# Patient Record
Sex: Female | Born: 1970 | Race: Black or African American | Hispanic: No | Marital: Married | Smoking: Never smoker
Health system: Southern US, Community
[De-identification: ages and names within clinical notes are randomized; demographics above are authoritative.]

## PROBLEM LIST (undated history)

## (undated) DIAGNOSIS — E041 Nontoxic single thyroid nodule: Secondary | ICD-10-CM

## (undated) DIAGNOSIS — R599 Enlarged lymph nodes, unspecified: Secondary | ICD-10-CM

## (undated) HISTORY — DX: Enlarged lymph nodes, unspecified: R59.9

## (undated) HISTORY — PX: POLYPECTOMY: SHX149

## (undated) HISTORY — DX: Nontoxic single thyroid nodule: E04.1

---

## 1999-01-25 ENCOUNTER — Encounter: Payer: Self-pay | Admitting: Emergency Medicine

## 1999-01-25 ENCOUNTER — Emergency Department (HOSPITAL_COMMUNITY): Admission: EM | Admit: 1999-01-25 | Discharge: 1999-01-25 | Payer: Self-pay | Admitting: Emergency Medicine

## 1999-02-04 ENCOUNTER — Encounter: Payer: Self-pay | Admitting: General Surgery

## 1999-02-04 ENCOUNTER — Ambulatory Visit (HOSPITAL_COMMUNITY): Admission: RE | Admit: 1999-02-04 | Discharge: 1999-02-04 | Payer: Self-pay | Admitting: General Surgery

## 1999-03-16 ENCOUNTER — Emergency Department (HOSPITAL_COMMUNITY): Admission: EM | Admit: 1999-03-16 | Discharge: 1999-03-16 | Payer: Self-pay | Admitting: Emergency Medicine

## 2000-02-02 ENCOUNTER — Other Ambulatory Visit: Admission: RE | Admit: 2000-02-02 | Discharge: 2000-02-02 | Payer: Self-pay | Admitting: Obstetrics and Gynecology

## 2000-02-02 ENCOUNTER — Encounter (INDEPENDENT_AMBULATORY_CARE_PROVIDER_SITE_OTHER): Payer: Self-pay | Admitting: Specialist

## 2001-01-24 ENCOUNTER — Other Ambulatory Visit: Admission: RE | Admit: 2001-01-24 | Discharge: 2001-01-24 | Payer: Self-pay | Admitting: Obstetrics and Gynecology

## 2002-01-24 ENCOUNTER — Other Ambulatory Visit: Admission: RE | Admit: 2002-01-24 | Discharge: 2002-01-24 | Payer: Self-pay | Admitting: Obstetrics and Gynecology

## 2002-06-15 ENCOUNTER — Ambulatory Visit (HOSPITAL_COMMUNITY): Admission: RE | Admit: 2002-06-15 | Discharge: 2002-06-15 | Payer: Self-pay | Admitting: Obstetrics and Gynecology

## 2002-06-15 ENCOUNTER — Encounter (INDEPENDENT_AMBULATORY_CARE_PROVIDER_SITE_OTHER): Payer: Self-pay | Admitting: *Deleted

## 2002-07-16 ENCOUNTER — Emergency Department (HOSPITAL_COMMUNITY): Admission: EM | Admit: 2002-07-16 | Discharge: 2002-07-16 | Payer: Self-pay | Admitting: Emergency Medicine

## 2003-03-14 ENCOUNTER — Other Ambulatory Visit: Admission: RE | Admit: 2003-03-14 | Discharge: 2003-03-14 | Payer: Self-pay | Admitting: Obstetrics and Gynecology

## 2003-04-06 ENCOUNTER — Encounter: Payer: Self-pay | Admitting: Family Medicine

## 2003-04-06 ENCOUNTER — Ambulatory Visit (HOSPITAL_COMMUNITY): Admission: RE | Admit: 2003-04-06 | Discharge: 2003-04-06 | Payer: Self-pay | Admitting: Family Medicine

## 2003-05-16 ENCOUNTER — Encounter (INDEPENDENT_AMBULATORY_CARE_PROVIDER_SITE_OTHER): Payer: Self-pay | Admitting: *Deleted

## 2003-05-16 ENCOUNTER — Ambulatory Visit (HOSPITAL_COMMUNITY): Admission: RE | Admit: 2003-05-16 | Discharge: 2003-05-16 | Payer: Self-pay | Admitting: Endocrinology

## 2003-05-16 ENCOUNTER — Encounter: Payer: Self-pay | Admitting: Endocrinology

## 2003-08-09 ENCOUNTER — Other Ambulatory Visit: Admission: RE | Admit: 2003-08-09 | Discharge: 2003-08-09 | Payer: Self-pay | Admitting: Obstetrics and Gynecology

## 2003-08-10 ENCOUNTER — Emergency Department (HOSPITAL_COMMUNITY): Admission: EM | Admit: 2003-08-10 | Discharge: 2003-08-11 | Payer: Self-pay | Admitting: Emergency Medicine

## 2003-11-19 ENCOUNTER — Ambulatory Visit (HOSPITAL_COMMUNITY): Admission: RE | Admit: 2003-11-19 | Discharge: 2003-11-19 | Payer: Self-pay | Admitting: Obstetrics and Gynecology

## 2004-01-28 ENCOUNTER — Ambulatory Visit (HOSPITAL_COMMUNITY): Admission: RE | Admit: 2004-01-28 | Discharge: 2004-01-28 | Payer: Self-pay | Admitting: Obstetrics & Gynecology

## 2004-01-30 ENCOUNTER — Observation Stay (HOSPITAL_COMMUNITY): Admission: AD | Admit: 2004-01-30 | Discharge: 2004-01-31 | Payer: Self-pay | Admitting: Obstetrics and Gynecology

## 2004-03-20 ENCOUNTER — Ambulatory Visit (HOSPITAL_COMMUNITY): Admission: RE | Admit: 2004-03-20 | Discharge: 2004-03-20 | Payer: Self-pay | Admitting: Obstetrics and Gynecology

## 2004-04-05 ENCOUNTER — Inpatient Hospital Stay (HOSPITAL_COMMUNITY): Admission: AD | Admit: 2004-04-05 | Discharge: 2004-04-08 | Payer: Self-pay | Admitting: Obstetrics and Gynecology

## 2005-04-01 ENCOUNTER — Inpatient Hospital Stay (HOSPITAL_COMMUNITY): Admission: AD | Admit: 2005-04-01 | Discharge: 2005-04-01 | Payer: Self-pay | Admitting: Obstetrics and Gynecology

## 2005-04-20 ENCOUNTER — Other Ambulatory Visit: Admission: RE | Admit: 2005-04-20 | Discharge: 2005-04-20 | Payer: Self-pay | Admitting: Obstetrics and Gynecology

## 2005-04-20 IMAGING — US US OB COMP +14 WK
1 series · 13 of 28 positions shown · non-contrast
Comparison: none

CLINICAL DATA: Assess fetal anatomy.  G2 P0 TAB1.  Patient states maternal serum screening was normal.

[Series 1: unknown · 0.23mm/px · 13 of 79 slices shown]
[im 3/79]
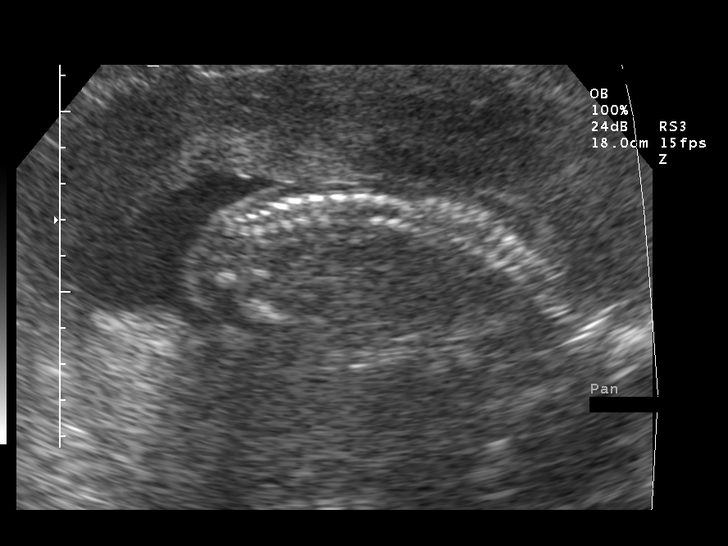
[im 9/79]
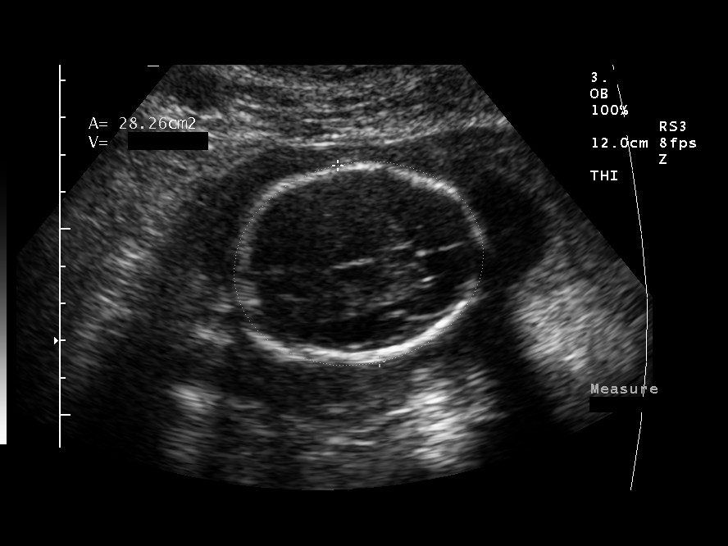
[im 15/79]
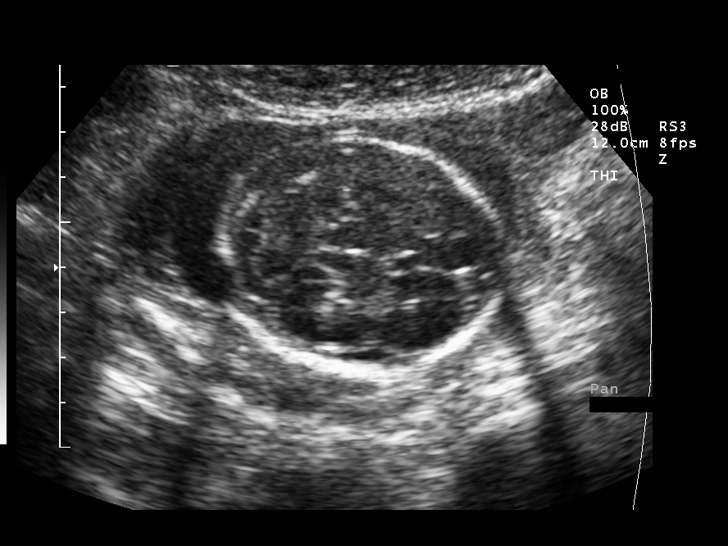
[im 21/79]
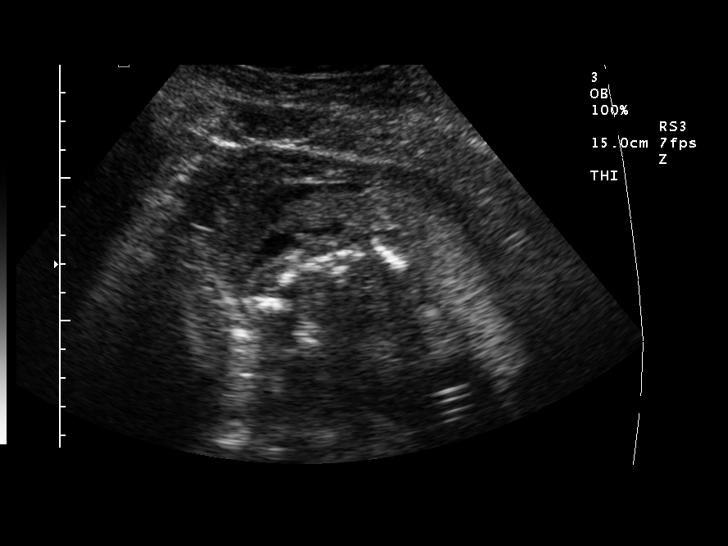
[im 27/79]
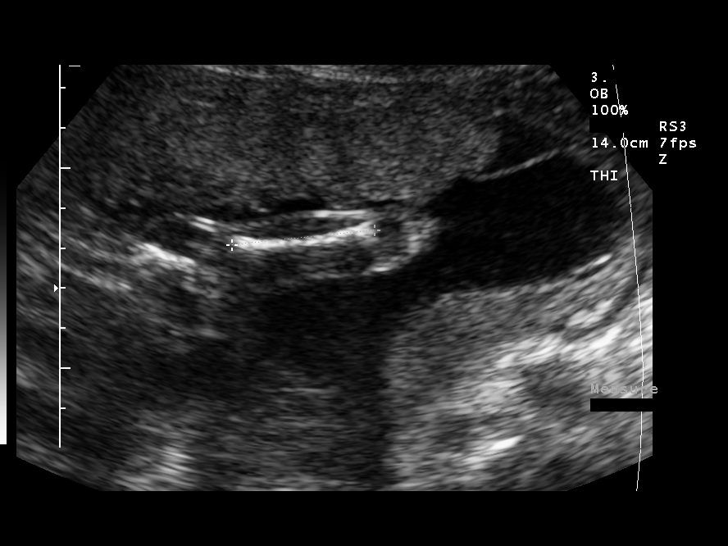
[im 32/79]
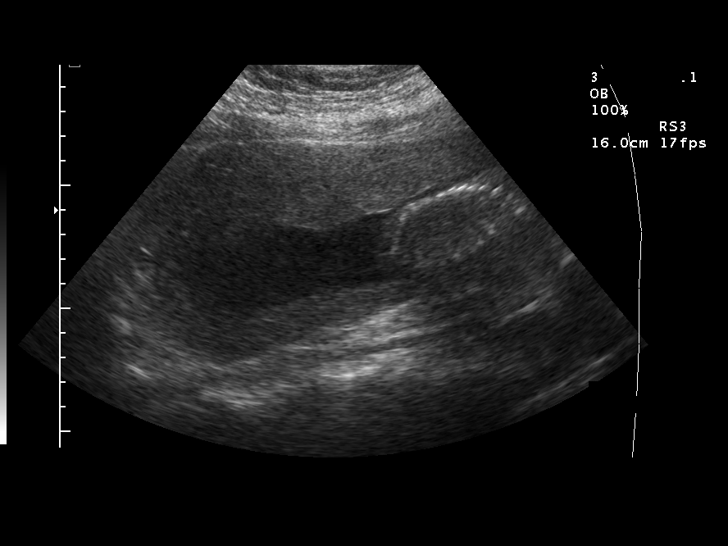
[im 41/79]
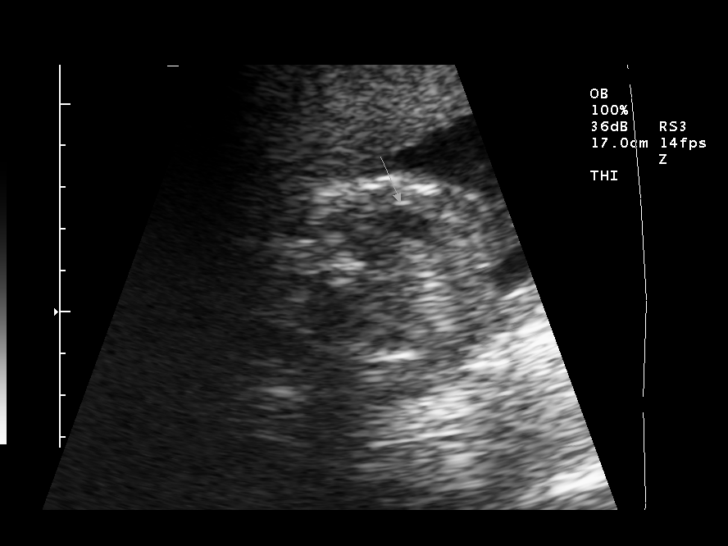
[im 47/79]
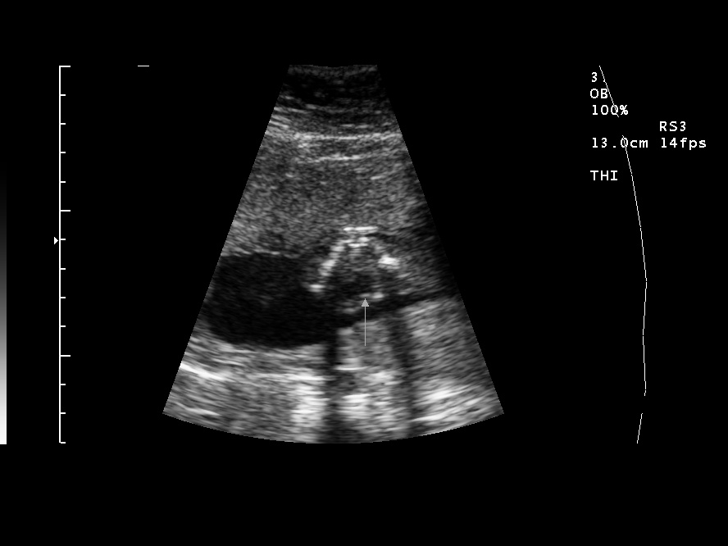
[im 53/79]
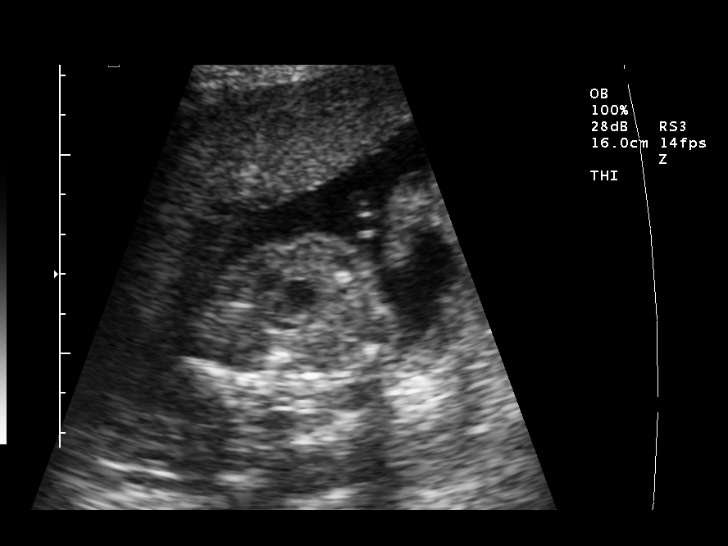
[im 58/79]
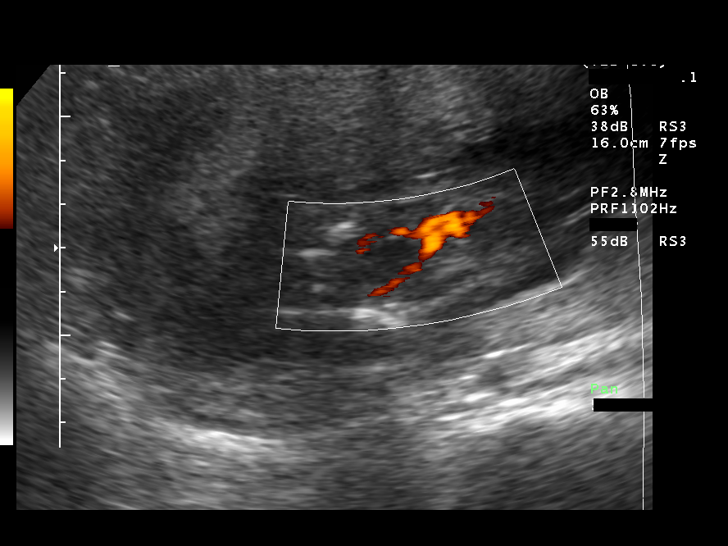
[im 64/79]
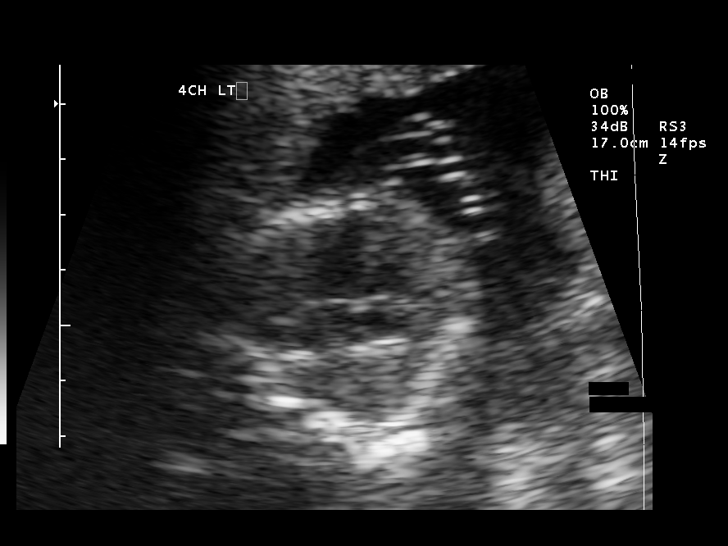
[im 70/79]
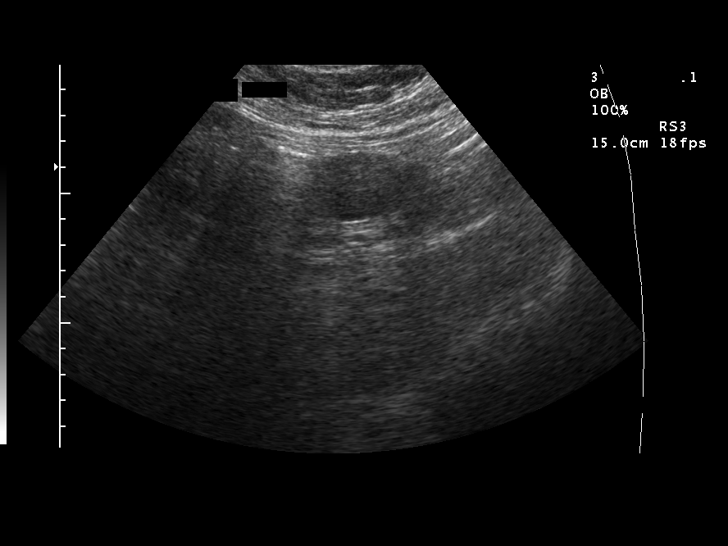
[im 76/79]
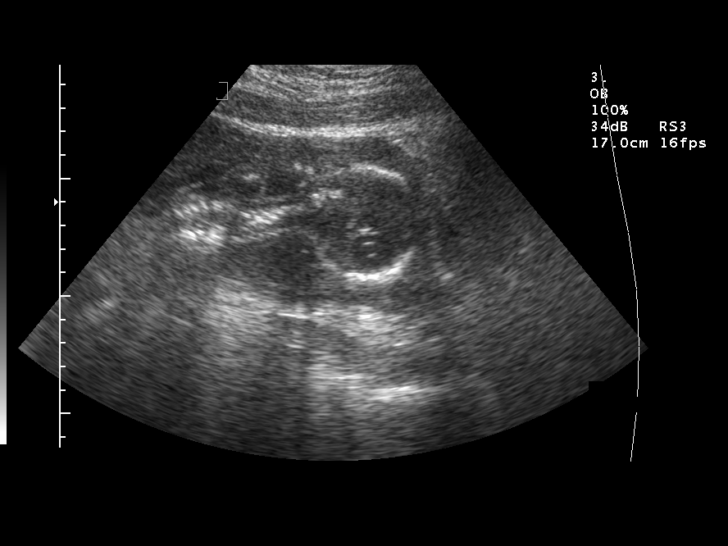

[13 of 28 positions shown; findings below may reference images not displayed]

OBSTETRICAL ULTRASOUND

NUMBER OF FETUSES:  1
HEART RATE:  158
MOVEMENT:  Yes
BREATHING:  No  
PRESENTATION:  Cephalic
PLACENTAL LOCATION:  Anterior
GRADE:  I
PREVIA:  No
AMNIOTIC FLUID (SUBJECTIVE):  Normal
AMNIOTIC FLUID (OBJECTIVE):   2.8 cm Vertical pocket 

FETAL BIOMETRY
BPD:  5.0 cm   21 w 1 d
HC:  19.0 cm  21 w 2 d
AC:  15.9 cm   21 w 0 d
FL:  3.6 cm   21 w 2 d
MEAN GA:      21 w 1 d
1st office US GA:         20 w 3 d (assigned)

FETAL ANATOMY
LATERAL VENTRICLES:    Visualized 
THALAMI/CSP:      Visualized 
POSTERIOR FOSSA:    Visualized 
NUCHAL REGION:    N/A
SPINE:      Visualized 
4 CHAMBER HEART ON LEFT:      Visualized 
STOMACH ON LEFT:      Visualized 
3 VESSEL CORD:    Visualized 
CORD INSERTION SITE:    Visualized 
KIDNEYS:    Visualized 
BLADDER:    Visualized 
EXTREMITIES:      Visualized 

ADDITIONAL ANATOMY VISUALIZED:    LVOT, RVOT, upper lip, orbits, profile, diaphragm, heel, 5th digit, ductal arch, aortic arch, and male genitalia

EVALUATION LIMITED BY:  Maternal habitus

MATERNAL FINDINGS
CERVIX:   3.7 cm Transabdominally
IMPRESSION: Single living intrauterine fetus in cephalic presentation.  Patient is 20 weeks 3 days by first office ultrasound and measures 21 weeks 1 day today.  Growth is appropriate.
No focal fetal or placental abnormality identified.

## 2005-06-23 ENCOUNTER — Ambulatory Visit (HOSPITAL_COMMUNITY): Admission: RE | Admit: 2005-06-23 | Discharge: 2005-06-23 | Payer: Self-pay | Admitting: Obstetrics and Gynecology

## 2005-06-23 ENCOUNTER — Encounter (INDEPENDENT_AMBULATORY_CARE_PROVIDER_SITE_OTHER): Payer: Self-pay | Admitting: *Deleted

## 2006-04-15 ENCOUNTER — Emergency Department (HOSPITAL_COMMUNITY): Admission: EM | Admit: 2006-04-15 | Discharge: 2006-04-15 | Payer: Self-pay | Admitting: Emergency Medicine

## 2007-02-25 ENCOUNTER — Ambulatory Visit (HOSPITAL_COMMUNITY): Admission: RE | Admit: 2007-02-25 | Discharge: 2007-02-25 | Payer: Self-pay | Admitting: Endocrinology

## 2007-03-10 ENCOUNTER — Encounter: Admission: RE | Admit: 2007-03-10 | Discharge: 2007-03-10 | Payer: Self-pay | Admitting: Endocrinology

## 2007-03-10 ENCOUNTER — Other Ambulatory Visit: Admission: RE | Admit: 2007-03-10 | Discharge: 2007-03-10 | Payer: Self-pay | Admitting: Interventional Radiology

## 2007-03-10 ENCOUNTER — Encounter (INDEPENDENT_AMBULATORY_CARE_PROVIDER_SITE_OTHER): Payer: Self-pay | Admitting: Specialist

## 2010-03-28 ENCOUNTER — Encounter: Admission: RE | Admit: 2010-03-28 | Discharge: 2010-03-28 | Payer: Self-pay | Admitting: Internal Medicine

## 2010-07-14 ENCOUNTER — Encounter
Admission: RE | Admit: 2010-07-14 | Discharge: 2010-07-14 | Payer: Self-pay | Source: Home / Self Care | Admitting: Obstetrics and Gynecology

## 2010-09-01 ENCOUNTER — Encounter
Admission: RE | Admit: 2010-09-01 | Discharge: 2010-09-01 | Payer: Self-pay | Source: Home / Self Care | Attending: Obstetrics and Gynecology | Admitting: Obstetrics and Gynecology

## 2010-10-24 ENCOUNTER — Emergency Department (HOSPITAL_COMMUNITY): Admission: EM | Admit: 2010-10-24 | Discharge: 2010-10-24 | Payer: Self-pay | Admitting: Emergency Medicine

## 2010-11-30 HISTORY — PX: CHOLECYSTECTOMY: SHX55

## 2010-12-21 ENCOUNTER — Encounter: Payer: Self-pay | Admitting: Family Medicine

## 2011-04-17 NOTE — Op Note (Signed)
NAME:  [name], [name]                       ACCOUNT NO.:  [id number]   MEDICAL RECORD NO.:  [id number]                   PATIENT TYPE:  INP   LOCATION:  9128                                 FACILITY:  [facility]   PHYSICIAN:  Gerrit Friends. Aldona Bar, [name]                DATE OF BIRTH:  December 08, 1970   DATE OF PROCEDURE:  04/05/2004  DATE OF DISCHARGE:                                 OPERATIVE REPORT   PREOPERATIVE DIAGNOSES:  1. Term pregnancy.  2. Nonreassuring fetal heart tracing.  3. Questionable cephalopelvic disproportion.   POSTOPERATIVE DIAGNOSES:  1. Term pregnancy.  2. Nonreassuring fetal heart tracing.  3. Questionable cephalopelvic disproportion.  4. Delivery of 7 pound 15 ounce female infant, Apgars 8 and 9.   PROCEDURE:  Primary low transverse cesarean section.   ANESTHESIA:  Spinal, Raul Del, [name]   HISTORY:  This 40 year old gravida 2, para 0, was admitted on the morning of  May 7 in early active labor.  She was having good contractions.  Her  pregnancy was complicated by positive group B strep antenatally.   At the time of admission the cervix was 1 cm plus dilated, about 80%  effaced, with the vertex essentially at -3 station.  After observation for a  period of time the baby descended to -2 station, at which time amniotomy was  carried out with production of clear fluid.  A pressure catheter was placed  and ultimately a scalp electrode was placed also.  At the time of admission  the fetal heart was reassuring but not reactive.  As her labor continued and  her contractions intensified as documented well by the IUPC, fetal heart  essentially developed no variability although no decelerations were seen.  The patient's cervix essentially was unchanged.  She was observed for a  period of several hours with essentially no change to her cervix and  decreasing variability.  Because of a nonreassuring fetal heart tracing in a  primigravida at term in early labor as well as a  presentation that was  relatively unfavorable for a primigravida at term in labor, it was felt best  to proceed with a primary cesarean section for delivery.   The patient was taken to the operating room, where after the satisfactory  induction of a spinal anesthetic by Dr. Tacy Dura, she was prepped and draped  in the usual manner.  A Foley catheter had been inserted prior to her  arrival in the labor area.   The patient's abdomen was taped up to allow good access to the lower  abdomen.  Once the patient was adequately draped and good anesthetic levels  were documented, the procedure was begun.  A Pfannenstiel incision was made  and with minimal difficulty dissected down sharply to and through the fascia  in a low transverse fashion.  Subfascial space was created inferiorly and  superiorly, muscles separated in the midline, peritoneum identified and  entered appropriately with care taken to avoid the bowel superiorly and the  bladder inferiorly.  At this time the vesicouterine peritoneum was  identified and incised in a low transverse fashion, pushed off the lower  uterine segment with ease, and then sharp incision into the uterus in a low  transverse fashion was made with the Metzenbaum scissors.  Fluid was clear  and with some difficulty, the baby was delivered from a vertex position.  There was some difficulty delivering the posterior shoulder.   Once the baby was delivered it cried spontaneously at once, was a female, was  found to weigh 7 pounds 15 ounces, and Apgars were assigned at 8 and 9.  The  infant was subsequently taken to the nursery in good condition.  After the  cord was clamped, the cord bloods were collected and placenta was delivered  intact.  The uterus was then exteriorized and manually rendered free of any  remaining products of conception.  Good uterine contractility was afforded  with slowly-given intravenous Pitocin and manual stimulation.  At this time  closure of  the uterine incision was carried out using first a single layer  of #1 Vicryl in a running locking fashion, followed by a layer of #1 Vicryl  in a running imbricating fashion.  An additional figure-of-eight of #1  Vicryl was applied to the right angle for additional hemostasis.  At this  time the incision looked dry, the uterus was well-contracted, tubes and  ovaries appeared to be normal and after the abdomen was lavaged of all free  blood and clot, the uterus was replaced into the abdomen.  At this time with  all counts being correct and no foreign bodies noted to be remaining in the  abdominal cavity, closure of the abdomen was begun in layers.  The abdominal  peritoneum was closed with 0 Vicryl in a running fashion and muscle secured  with same.  Assured of good fascial hemostasis, the fascia was then  reapproximated from angle to midline using 0 Vicryl in a running fashion.  The subcutaneous tissue was then rendered hemostatic and staples were then  used to close the skin.  A sterile pressure dressing was applied and the  patient at this time was transported to the recovery room in satisfactory  condition, having tolerated the procedure well.  The estimated blood loss  was 500 mL.  All counts correct x2.  In conclusion this patient, who had  essentially a nonreassuring fetal heart tracing on presentation, was  ultimately delivered by cesarean section because of a question of  cephalopelvic disproportion as well.  She was delivered of a 7 pound 15  ounce female infant with Apgars of 8 and 9.  At the conclusion of the  procedure both mother and baby were doing well in their respective recovery  areas.                                               Gerrit Friends. Aldona Bar, [name]    RMW/MEDQ  D:  04/05/2004  T:  04/07/2004  Job:  [id number]

## 2011-04-17 NOTE — Discharge Summary (Signed)
NAME:  [name], [name]                       ACCOUNT NO.:  [id number]   MEDICAL RECORD NO.:  [id number]                   PATIENT TYPE:  INP   LOCATION:  9128                                 FACILITY:  [facility]   PHYSICIAN:  Miguel Aschoff, [name]                    DATE OF BIRTH:  1970/12/31   DATE OF ADMISSION:  04/05/2004  DATE OF DISCHARGE:  04/08/2004                                 DISCHARGE SUMMARY   DISCHARGE DIAGNOSES:  1. Intrauterine pregnancy at term.  2. Nonreassuring fetal heart rate tracing.  3. Questionable cephalopelvic disproportion.   PROCEDURE:  Primary low transverse cesarean section.   SURGEON:  Gerrit Friends. Aldona Bar, [name]   COMPLICATIONS:  None.   HISTORY OF PRESENT ILLNESS:  This 40 year old gravida 2, para 0, presents at  term in early labor.  The patient's antepartum course had been  uncomplicated.  The patient did have a positive Group B Strep culture that  was performed in the office.   HOSPITAL COURSE:  The patient was admitted at this time and started on IV  penicillin.  Upon admission, she was having good contractions.  Her cervix  was about 1 cm dilated, 80% effaced, and -3 station.  The baby descended to  about a -2 station.  Amniotomy was carried out for clear fluid.  IUPC's were  placed.  The fetal heart rate was reassuring, but not completely reactive.  As the labor continued, fetal heart rate developed no variability, although,  there were no decelerations noted and the patient's cervix was essentially  unchanged.  This was observed for several hours with no change in her cervix  and decreasing variability.  Because of this nonreassuring fetal heart rate  tracing in early labor, a decision was made to proceed with a primary  cesarean section.  The patient was taken to the operating room on Apr 05, 2004, by Dr. Aldona Bar where a primary low transverse cesarean section was  performed with the delivery of a 7 pound 15 ounce female infant with Apgars of  8 and 9.   Delivery went without complications.  The patient's postoperative  course was benign without significant fevers. She was felt ready for  discharge on postoperative day #3.  She was sent home on a regular diet,  told to decrease activity, told to continue prenatal vitamins, and given a  prescription for Tylox one to two every four hours as needed for pain.  She  is to follow up in the office in four weeks.   DISCHARGE LABORATORY DATA:  Hemoglobin 10.9, white blood cell count 12.1.     Leilani Able, P.A.-C.                Miguel Aschoff, [name]    MB/MEDQ  D:  04/28/2004  T:  04/28/2004  Job:  [id number]

## 2011-04-17 NOTE — Discharge Summary (Signed)
NAME:  [name], [name]                       ACCOUNT NO.:  [id number]   MEDICAL RECORD NO.:  [id number]                   PATIENT TYPE:  OBV   LOCATION:  9168                                 FACILITY:  [facility]   PHYSICIAN:  Malva Limes, [name]                 DATE OF BIRTH:  Mar 10, 1971   DATE OF ADMISSION:  01/30/2004  DATE OF DISCHARGE:  01/31/2004                                 DISCHARGE SUMMARY   FINAL DIAGNOSES:  1. Intrauterine pregnancy at 30 and five-sevenths weeks gestation.  2. Pregnancy-induced hypertension.   HOSPITAL COURSE:  This 40 year old G1 P0-0-1-0 presents at 66 and five-  sevenths weeks gestation with some elevated blood pressures.  The patient's  antepartum course had been uncomplicated up to this point.  She was not  having any headaches or visual changes or epigastric pain.  She had had a  BPP performed on January 28, 2004 with an 8/8 and AFI of 10.8.  She was  seen today in the office with a blood pressure of 138/92 and her nonstress  test was nonreactive after 40 minutes.  At this point she is being admitted  to monitor fetal well-being and to assess the pregnancy-induced high blood  pressure.  Upon admission she did have 1+ lower extremity edema.  Her blood  pressure was about 138/92.  A PIH panel was drawn.  A repeat NST with BPP  was performed and 24-hour urine was collected.  The patient was stable  during her hospital stay, felt ready for discharge on hospital day #2.  All  the lab work was normal - liver function tests, uric acid.  She had a  hemoglobin of 12.4, white blood cell count of 12.0.  She had a normal BPP  with a score of 8/8 and an AFI of 11.9.  At this point the patient was felt  stable.  The 24-hour urine was finished and once we got those results back  we would decide what further management we needed on this patient.  She was  to be following up in the office on March 7 with Dr. Dareen Piano; was to call,  of course, before then with any  increased problems.     Leilani Able, P.A.-C.                Malva Limes, [name]    MB/MEDQ  D:  02/27/2004  T:  02/27/2004  Job:  [id number]

## 2011-04-17 NOTE — Op Note (Signed)
NAMEAVY, BARLETT NO.:  [id number]   MEDICAL RECORD NO.:  [id number]          PATIENT TYPE:  AMB   LOCATION:  SDC                           FACILITY:  [facility]   PHYSICIAN:  Carrington Clamp, [name] DATE OF BIRTH:  [birth year]   DATE OF PROCEDURE:  06/23/2005  DATE OF DISCHARGE:                                 OPERATIVE REPORT   PREOPERATIVE DIAGNOSIS:  Menometrorrhagia with endometrial thickening.   POSTOPERATIVE DIAGNOSIS:  Menometrorrhagia with endometrial thickening plus  possible endometrial polyps.   OPERATION/PROCEDURE:  Dilatation and curettage with hysteroscopy.   SURGEON:  Carrington Clamp, [name]   ASSISTANT:  None.   ANESTHESIA:  LMA.   SPECIMENS:  Uterine curettings.   ESTIMATED BLOOD LOSS:  Minimal.   IV FLUIDS:  700 mL.   URINARY OUTPUT:  Not measured.   COMPLICATIONS:  None.   FINDINGS:  Shaggy endometrium with possible polyps.  Counts were correct x3.  There were no medications.   DESCRIPTION OF PROCEDURE:  After adequate LMA anesthesia was achieved, the  patient was prepped and draped in the usual sterile fashion in the dorsal  lithotomy position.  Speculum was placed in the vagina after the red rubber  catheter had been emptied.  Had emptied the bladder and the single-tooth  tenaculum was used to grasp the cervix.  The cervix was dilated with Shawnie Pons  dilators and the hysteroscope passed inside.  A very shaggy endometrium with  possible multiple polyps were seen. The hysteroscope was removed and polyp  forceps were used to remove as much as possible and then curettage was  performed with the sharp curet.  This was alternating with hysteroscopy  until the hysteroscope noted that there was still endometrial tissue but  most of the polyps and larger groupings of  endometrium had been removed.  There was no vigorous scraping done, just the  gentle scraping in order to insure the removal of as much tissue without  interrupting the basal cell  layer.  All instruments were then withdrawn from  the vagina and the patient tolerated the procedure well and was returned to  the recovery room in stable condition.      Carrington Clamp, [name]  Electronically Signed     MH/MEDQ  D:  06/23/2005  T:  06/23/2005  Job:  [id number]

## 2011-04-17 NOTE — Op Note (Signed)
[facility]  Patient:    JAVAEH, MUSCATELLO Visit Number: [id number] MRN: [id number]          Service Type: DSU Location: [facility] Attending Physician:  Sharon Mt Dictated by:   Rande Brunt. Eda Paschal, [name] Proc. Date: 06/15/02 Admit Date:  06/15/2002 Discharge Date: 06/15/2002                             Operative Report  PREOPERATIVE DIAGNOSIS:       Dysfunctional uterine bleeding with endometrial polyp.  POSTOPERATIVE DIAGNOSIS:      Dysfunctional uterine bleeding with endometrial polyp.  OPERATION:                    Dilatation and curettage.  SURGEON:                      Daniel L. Eda Paschal, [name]  ANESTHESIA:                   General.  INDICATIONS:                  The patient is a 40 year old female who has had persistent menometrorrhagia.  In the office, sonohysterogram revealed a 2 cm intrauterine cavitary defect most consistent with an endometrial polyp.  She now enters the hospital for hysteroscopy, D&C and excision of the above.  FINDINGS:                     External and vaginal is within normal limits. Cervix is clean.  Uterus is normal size and shape without uterine descensus. Adnexa are palpably normal.  On hysteroscopic examination the patient had a multipolypoid lesion coming off the posterior fundal wall of about 2 cm.  In addition, she had extensive endometrial tissue from her Megace.  Other than this, intracavitary examination was completely unremarkable.  Tubal ostia, top of the fundus, anterior and posterior walls of the fundus, lower uterine segment and the cervical canal could all be visualized.  DESCRIPTION OF PROCEDURE:     After adequate general endotracheal anesthesia, the patient was placed in the dorsal lithotomy position, prepped and draped in the usual sterile manner.  A single-tooth tenaculum was placed in the anterior lip of the cervix.  The cervix was dilated to a #33 Pratt dilator.   A hysteroscopic examination was done with the hysteroscope, using 3% sorbitol to expand the intrauterine cavity and a camera for magnification.  An excellent view was obtained.  Pictures were taken for documentation.  The multipolypoid lesion was removed with the polyp forceps.  Sharp curettage was done removing most of the endometrium.  The patient was rehysteroscoped and she now had a clean cavity.  Estimated blood loss for the entire procedure was less than 100 cc with none replaced.  Fluid deficit was 250 cc.  The patient tolerated the procedure well and left the operating room in satisfactory condition. Dictated by:   Rande Brunt. Eda Paschal, [name] Attending Physician:  Sharon Mt DD:  06/15/02 TD:  06/20/02 Job: [id number]

## 2011-05-21 ENCOUNTER — Encounter (HOSPITAL_COMMUNITY): Payer: Self-pay | Admitting: Radiology

## 2011-05-21 ENCOUNTER — Emergency Department (HOSPITAL_COMMUNITY)
Admission: EM | Admit: 2011-05-21 | Discharge: 2011-05-21 | Disposition: A | Discharge status: Home / Self Care | Payer: 59 | Attending: Emergency Medicine | Admitting: Emergency Medicine

## 2011-05-21 ENCOUNTER — Emergency Department (HOSPITAL_COMMUNITY): Payer: 59

## 2011-05-21 ENCOUNTER — Inpatient Hospital Stay (INDEPENDENT_AMBULATORY_CARE_PROVIDER_SITE_OTHER)
Admission: RE | Admit: 2011-05-21 | Discharge: 2011-05-21 | Disposition: A | Discharge status: Home / Self Care | Payer: 59 | Source: Ambulatory Visit | Attending: Family Medicine | Admitting: Family Medicine

## 2011-05-21 DIAGNOSIS — K8020 Calculus of gallbladder without cholecystitis without obstruction: Secondary | ICD-10-CM | POA: Insufficient documentation

## 2011-05-21 DIAGNOSIS — R10813 Right lower quadrant abdominal tenderness: Secondary | ICD-10-CM

## 2011-05-21 DIAGNOSIS — R109 Unspecified abdominal pain: Secondary | ICD-10-CM | POA: Insufficient documentation

## 2011-05-21 LAB — DIFFERENTIAL
Basophils Absolute: 0 10*3/uL (ref 0.0–0.1)
Eosinophils Absolute: 0.1 10*3/uL (ref 0.0–0.7)
Eosinophils Relative: 1 % (ref 0–5)
Lymphocytes Relative: 23 % (ref 12–46)
Lymphs Abs: 2.3 10*3/uL (ref 0.7–4.0)
Monocytes Absolute: 0.6 10*3/uL (ref 0.1–1.0)
Monocytes Relative: 6 % (ref 3–12)
Neutro Abs: 7.1 10*3/uL (ref 1.7–7.7)

## 2011-05-21 LAB — URINALYSIS, ROUTINE W REFLEX MICROSCOPIC
Bilirubin Urine: NEGATIVE
Ketones, ur: 15 mg/dL — AB
Protein, ur: NEGATIVE mg/dL
Specific Gravity, Urine: 1.02 (ref 1.005–1.030)
Urobilinogen, UA: 1 mg/dL (ref 0.0–1.0)

## 2011-05-21 LAB — CBC
MCH: 23.5 pg — ABNORMAL LOW (ref 26.0–34.0)
WBC: 10.1 10*3/uL (ref 4.0–10.5)

## 2011-05-21 LAB — COMPREHENSIVE METABOLIC PANEL
ALT: 23 U/L (ref 0–35)
AST: 19 U/L (ref 0–37)
Albumin: 3.2 g/dL — ABNORMAL LOW (ref 3.5–5.2)
Alkaline Phosphatase: 50 U/L (ref 39–117)
BUN: 7 mg/dL (ref 6–23)
CO2: 22 mEq/L (ref 19–32)
Calcium: 7.9 mg/dL — ABNORMAL LOW (ref 8.4–10.5)
Chloride: 106 mEq/L (ref 96–112)
Creatinine, Ser: 0.57 mg/dL (ref 0.50–1.10)
Glucose, Bld: 87 mg/dL (ref 70–99)
Total Protein: 6.9 g/dL (ref 6.0–8.3)

## 2011-05-21 LAB — URINE MICROSCOPIC-ADD ON

## 2011-05-21 LAB — POCT URINALYSIS DIP (DEVICE): Glucose, UA: NEGATIVE mg/dL

## 2011-05-21 LAB — POCT PREGNANCY, URINE: Preg Test, Ur: NEGATIVE

## 2011-05-21 MED ORDER — IOHEXOL 300 MG/ML  SOLN
100.0000 mL | Freq: Once | INTRAMUSCULAR | Status: AC | PRN
  Administered 2011-05-21: 100 mL via INTRAVENOUS

## 2011-06-19 ENCOUNTER — Encounter (INDEPENDENT_AMBULATORY_CARE_PROVIDER_SITE_OTHER): Payer: Self-pay | Admitting: Surgery

## 2011-06-25 ENCOUNTER — Ambulatory Visit (INDEPENDENT_AMBULATORY_CARE_PROVIDER_SITE_OTHER): Payer: 59 | Admitting: Surgery

## 2011-06-25 ENCOUNTER — Encounter (INDEPENDENT_AMBULATORY_CARE_PROVIDER_SITE_OTHER): Payer: Self-pay | Admitting: Surgery

## 2011-06-25 VITALS — BP 128/82 | HR 84 | Temp 96.6°F

## 2011-06-25 DIAGNOSIS — K8010 Calculus of gallbladder with chronic cholecystitis without obstruction: Secondary | ICD-10-CM | POA: Insufficient documentation

## 2011-06-25 NOTE — Patient Instructions (Signed)
Call our surgery schedulers at [phone] when you are ready to schedule surgery 

## 2011-06-25 NOTE — Progress Notes (Signed)
[name] is a 40 y.o. female.    Chief Complaint  Patient presents with  . Abdominal Pain    Gallbladder    HPI HPI This is a 40 year old female who is a patient of Dr. Johny Blamer who presents with a recent episode of severe right upper quadrant abdominal pain. This occurred after a party at work where she today had a lot of snacks. The pain occurred in her right upper quadrant but moved into her back and spread down her right side. The pain persisted for about a day. This was associated with some nausea but no vomiting or diarrhea. She presented to the emergency department for evaluation on June 29. At that time her white count was normal at 10.1. Her liver function tests were within normal limits. A CT scan of the abdomen and pelvis was remarkable only for a 16 mm gallstone but no wall thickening. She has had no further episodes since that time.  Past Medical History  Diagnosis Date  . Swollen lymph nodes   . Thyroid nodule     Past Surgical History  Procedure Date  . Cesarean section   . Polypectomy ?    uterine    No family history on file.  Social History History  Substance Use Topics  . Smoking status: Never Smoker   . Smokeless tobacco: Never Used  . Alcohol Use: No    No Known Allergies  Current Outpatient Prescriptions  Medication Sig Dispense Refill  . Prenatal MV-Min-Fe Fum-FA-DHA (PRENATAL 1 PO) Take 1 tablet by mouth daily.          Review of Systems ROS Positive only for thyroid nodules, and recent episode of abdominal pain Physical Exam Physical Exam   Blood pressure 128/82, pulse 84, temperature 96.6 F (35.9 C), temperature source Temporal. WDWN in NAD HEENT:  EOMI, sclera anicteric Neck:  No masses, no thyromegaly Lungs:  CTA bilaterally; normal respiratory effort CV:  Regular rate and rhythm; no murmurs Abd:  +bowel sounds, soft, non-tender, no masses Ext:  Well-perfused; no edema Skin:  Warm, dry; no sign of  jaundice   Assessment/Plan  Chronic calculus cholecystitis  Recommend laparoscopic cholecystectomy with intraoperative cholangiogram.  I discussed the procedure in detail.  The patient was given Agricultural engineer.  We discussed the risks and benefits of a laparoscopic cholecystectomy including, but not limited to bleeding, infection, injury to surrounding structures such as the intestine or liver, bile leak, retained gallstones, need to convert to an open procedure, prolonged diarrhea, blood clots such as  DVT, common bile duct injury, anesthesia risks, and possible need for additional procedures.  We discussed the typical post-operative recovery course.   Davyd Podgorski K. 06/25/2011, 10:13 AM

## 2011-08-06 ENCOUNTER — Other Ambulatory Visit: Payer: Self-pay | Admitting: Obstetrics and Gynecology

## 2011-09-24 ENCOUNTER — Encounter (HOSPITAL_COMMUNITY)
Admission: RE | Admit: 2011-09-24 | Discharge: 2011-09-24 | Disposition: A | Discharge status: Home / Self Care | Payer: 59 | Source: Ambulatory Visit | Attending: Surgery | Admitting: Surgery

## 2011-09-24 LAB — SURGICAL PCR SCREEN: MRSA, PCR: NEGATIVE

## 2011-09-24 LAB — HCG, SERUM, QUALITATIVE: Preg, Serum: NEGATIVE

## 2011-09-24 LAB — CBC
Hemoglobin: 12.4 g/dL (ref 12.0–15.0)
MCHC: 32.3 g/dL (ref 30.0–36.0)
RBC: 5.33 MIL/uL — ABNORMAL HIGH (ref 3.87–5.11)

## 2011-09-24 NOTE — Progress Notes (Signed)
Quick Note:  This patient may proceed with surgery ______ 

## 2011-09-28 ENCOUNTER — Ambulatory Visit (HOSPITAL_COMMUNITY): Payer: 59

## 2011-09-28 ENCOUNTER — Ambulatory Visit (HOSPITAL_COMMUNITY)
Admission: RE | Admit: 2011-09-28 | Discharge: 2011-09-28 | Disposition: A | Discharge status: Home / Self Care | Payer: 59 | Source: Ambulatory Visit | Attending: Surgery | Admitting: Surgery

## 2011-09-28 ENCOUNTER — Other Ambulatory Visit (INDEPENDENT_AMBULATORY_CARE_PROVIDER_SITE_OTHER): Payer: Self-pay | Admitting: Surgery

## 2011-09-28 DIAGNOSIS — K8010 Calculus of gallbladder with chronic cholecystitis without obstruction: Secondary | ICD-10-CM

## 2011-09-28 DIAGNOSIS — Z01812 Encounter for preprocedural laboratory examination: Secondary | ICD-10-CM | POA: Insufficient documentation

## 2011-09-29 NOTE — Op Note (Signed)
[name], [name] NO.:  [id number]  MEDICAL RECORD NO.:  [id number]  LOCATION:  SDSC                         FACILITY:  [facility]  PHYSICIAN:  [name], [name] DATE OF BIRTH:  07-May-1971  DATE OF PROCEDURE:  09/28/2011 DATE OF DISCHARGE:                              OPERATIVE REPORT   PREOPERATIVE DIAGNOSIS:  Chronic calculus cholecystitis.  POSTOPERATIVE DIAGNOSIS:  Chronic calculus cholecystitis.  PROCEDURE:  Laparoscopic cholecystectomy with intraoperative cholangiogram.  SURGEON:  [name], MD  ASSISTANT:  Dr. Estelle Grumbles.  ANESTHESIA:  General endotracheal.  INDICATIONS:  This is a 40 year old female who presents with a recent attack of severe right upper quadrant abdominal pain.  She was evaluated in the emergency department was noted to have normal liver function tests.  A CT scan showed a 16 mm gallstones, but no wall thickening. She presents now for cholecystectomy.  DESCRIPTION OF PROCEDURE:  The patient was brought to the operating room, placed in supine position on operating table.  After adequate level of general anesthesia was obtained, the patient's abdomen was prepped with ChloraPrep and draped in sterile fashion.  Time-out was taken to the proper patient, proper procedure.  We infiltrated the area above the umbilicus with 0.25% Marcaine with epinephrine.  A transverse incision was made.  Dissection was carried down to the fascia.  The fascia was incised vertically.  We entered the peritoneal cavity bluntly.  A stay suture of 0 Vicryl was placed around the fascial opening.  The Hasson cannula was inserted and secured to stay suture. Pneumoperitoneum was obtained by insufflating CO2 maintaining a maximum pressure of 15 mmHg.  The laparoscope was inserted.  The patient was positioned in reverse Trendelenburg, tilted to her left.  An 11 mm port was placed in the subxiphoid position.  Two 5 mm ports were placed in the right  upper quadrant.  The gallbladder showed some chronic scarring but no acute inflammation.  We switched to a 30 degree laparoscope.  The gallbladder was grasped with clamp and lifted.  We opened the peritoneum around the hilum of the gallbladder dissected around the cystic duct and cystic artery.  A small opening was created on the cystic duct.  After ligated with a clip distally.  A Cook cholangiogram catheter was inserted and secured with a clip.  A cholangiogram was then obtained which showed good flow proximally and distally biliary tree with no sign of filling defect.  Contrast flowed easily into the duodenum.  The catheter was removed and the cystic duct was ligated with clips and divided.  The cystic artery was ligated with clips and divided.  The gallbladder was then dissected free from the liver.  The gallbladder was placed in an EndoCatch sac.  We cauterized the gallbladder fossa thoroughly for hemostasis.  The gallbladder and EndoCatch sac were then removed through the umbilical port site.  The fascia was closed with the pursestring suture.  We suctioned out any remaining irrigation and inspected again for hemostasis. Pneumoperitoneum was then released as we removed the trocars.  4-0 Monocryl was used to close the skin incisions.  Steri-Strips and clean dressings were applied.  The patient was extubated and brought to  recovery room in stable condition.  All sponge, instrument and needle counts correct.     [name], [name]     MKT/MEDQ  D:  09/28/2011  T:  09/28/2011  Job:  [id number]  Electronically Signed by Manus Rudd [name] on 09/29/2011 11:00:52 PM

## 2011-10-06 ENCOUNTER — Encounter (INDEPENDENT_AMBULATORY_CARE_PROVIDER_SITE_OTHER): Payer: Self-pay | Admitting: Surgery

## 2011-10-09 ENCOUNTER — Encounter (INDEPENDENT_AMBULATORY_CARE_PROVIDER_SITE_OTHER): Payer: Self-pay | Admitting: General Surgery

## 2011-10-12 ENCOUNTER — Encounter (INDEPENDENT_AMBULATORY_CARE_PROVIDER_SITE_OTHER): Payer: Self-pay | Admitting: Surgery

## 2011-10-13 ENCOUNTER — Ambulatory Visit (INDEPENDENT_AMBULATORY_CARE_PROVIDER_SITE_OTHER): Payer: 59 | Admitting: Surgery

## 2011-10-13 ENCOUNTER — Encounter (INDEPENDENT_AMBULATORY_CARE_PROVIDER_SITE_OTHER): Payer: Self-pay | Admitting: Surgery

## 2011-10-13 VITALS — BP 124/83 | HR 72 | Temp 97.8°F | Resp 14 | Ht 71.0 in | Wt 248.6 lb

## 2011-10-13 DIAGNOSIS — K8010 Calculus of gallbladder with chronic cholecystitis without obstruction: Secondary | ICD-10-CM

## 2011-10-13 NOTE — Progress Notes (Signed)
This patient is status post laparoscopic cholecystectomy with intraoperative cholangiogram.  The pathology report showed chronic cholecystitis.  The patient reports that the post-operative pain has resolved.  They have resumed a regular diet without problems and are having regular bowel movements.  On physical examination, all of the incisions are healing well with no signs of infection or bleeding.  Steri-strips have all been removed.  Impression:  The patient is doing well after laparoscopic cholecystectomy for chronic cholecystitis.  Plan:  The patient may resume full activity and regular diet.  They may follow-up with [place] on a PRN basis.

## 2011-10-13 NOTE — Patient Instructions (Signed)
Resume full activity  

## 2012-11-25 ENCOUNTER — Telehealth: Payer: Self-pay | Admitting: Internal Medicine

## 2012-11-25 NOTE — Telephone Encounter (Signed)
Patient received results of lab work. He states that he is unsure of the instructions that were given to him in the letter that he received. Please call him. [phone] ° ° [phone]

## 2012-11-28 NOTE — Telephone Encounter (Signed)
This pt has never seen Dr. Yetta Barre per EPIC, closing phone note.

## 2014-01-31 ENCOUNTER — Other Ambulatory Visit: Payer: Self-pay

## 2014-01-31 DIAGNOSIS — Z1231 Encounter for screening mammogram for malignant neoplasm of breast: Secondary | ICD-10-CM

## 2014-02-13 ENCOUNTER — Other Ambulatory Visit: Payer: Self-pay | Admitting: Obstetrics and Gynecology

## 2014-02-16 ENCOUNTER — Ambulatory Visit
Admission: RE | Admit: 2014-02-16 | Discharge: 2014-02-16 | Disposition: A | Discharge status: Home / Self Care | Payer: 59 | Source: Ambulatory Visit

## 2014-02-16 DIAGNOSIS — Z1231 Encounter for screening mammogram for malignant neoplasm of breast: Secondary | ICD-10-CM

## 2014-09-26 ENCOUNTER — Encounter (HOSPITAL_COMMUNITY): Payer: Self-pay | Admitting: Emergency Medicine

## 2014-09-26 ENCOUNTER — Emergency Department (HOSPITAL_COMMUNITY)
Admission: EM | Admit: 2014-09-26 | Discharge: 2014-09-26 | Disposition: A | Discharge status: Home / Self Care | Payer: 59 | Attending: Emergency Medicine | Admitting: Emergency Medicine

## 2014-09-26 ENCOUNTER — Other Ambulatory Visit: Payer: Self-pay

## 2014-09-26 ENCOUNTER — Emergency Department (HOSPITAL_COMMUNITY): Payer: 59

## 2014-09-26 ENCOUNTER — Emergency Department (HOSPITAL_COMMUNITY)
Admission: EM | Admit: 2014-09-26 | Discharge: 2014-09-26 | Disposition: A | Discharge status: Other Acute Inpatient Hospital | Payer: 59 | Source: Home / Self Care | Attending: Emergency Medicine | Admitting: Emergency Medicine

## 2014-09-26 DIAGNOSIS — R0602 Shortness of breath: Secondary | ICD-10-CM | POA: Diagnosis not present

## 2014-09-26 DIAGNOSIS — R079 Chest pain, unspecified: Secondary | ICD-10-CM | POA: Insufficient documentation

## 2014-09-26 DIAGNOSIS — E669 Obesity, unspecified: Secondary | ICD-10-CM | POA: Diagnosis not present

## 2014-09-26 DIAGNOSIS — Z79899 Other long term (current) drug therapy: Secondary | ICD-10-CM | POA: Diagnosis not present

## 2014-09-26 DIAGNOSIS — R0789 Other chest pain: Secondary | ICD-10-CM

## 2014-09-26 LAB — CBC WITH DIFFERENTIAL/PLATELET
BASOS PCT: 1 % (ref 0–1)
Basophils Absolute: 0.1 10*3/uL (ref 0.0–0.1)
Eosinophils Absolute: 0 10*3/uL (ref 0.0–0.7)
Eosinophils Relative: 0 % (ref 0–5)
HEMATOCRIT: 41.5 % (ref 36.0–46.0)
HEMOGLOBIN: 13.4 g/dL (ref 12.0–15.0)
LYMPHS ABS: 2.6 10*3/uL (ref 0.7–4.0)
LYMPHS PCT: 34 % (ref 12–46)
MCH: 23.1 pg — ABNORMAL LOW (ref 26.0–34.0)
MCHC: 32.3 g/dL (ref 30.0–36.0)
MCV: 71.7 fL — AB (ref 78.0–100.0)
MONOS PCT: 6 % (ref 3–12)
Monocytes Absolute: 0.5 10*3/uL (ref 0.1–1.0)
NEUTROS ABS: 4.5 10*3/uL (ref 1.7–7.7)
Neutrophils Relative %: 59 % (ref 43–77)
Platelets: 345 10*3/uL (ref 150–400)
RBC: 5.79 MIL/uL — ABNORMAL HIGH (ref 3.87–5.11)
RDW: 13.6 % (ref 11.5–15.5)
WBC: 7.7 10*3/uL (ref 4.0–10.5)

## 2014-09-26 LAB — COMPREHENSIVE METABOLIC PANEL
ALK PHOS: 67 U/L (ref 39–117)
ALT: 29 U/L (ref 0–35)
ANION GAP: 15 (ref 5–15)
AST: 25 U/L (ref 0–37)
Albumin: 4 g/dL (ref 3.5–5.2)
BILIRUBIN TOTAL: 0.4 mg/dL (ref 0.3–1.2)
BUN: 8 mg/dL (ref 6–23)
CHLORIDE: 102 meq/L (ref 96–112)
CO2: 23 meq/L (ref 19–32)
CREATININE: 0.64 mg/dL (ref 0.50–1.10)
Calcium: 9.2 mg/dL (ref 8.4–10.5)
GLUCOSE: 98 mg/dL (ref 70–99)
POTASSIUM: 4.4 meq/L (ref 3.7–5.3)
Sodium: 140 mEq/L (ref 137–147)
Total Protein: 8.1 g/dL (ref 6.0–8.3)

## 2014-09-26 LAB — I-STAT TROPONIN, ED
TROPONIN I, POC: 0 ng/mL (ref 0.00–0.08)
Troponin i, poc: 0 ng/mL (ref 0.00–0.08)

## 2014-09-26 MED ORDER — NITROGLYCERIN 0.4 MG SL SUBL
0.4000 mg | SUBLINGUAL_TABLET | SUBLINGUAL | Status: DC | PRN
Start: 2014-09-26 — End: 2014-09-26

## 2014-09-26 MED ORDER — ASPIRIN 81 MG PO CHEW
324.0000 mg | CHEWABLE_TABLET | Freq: Once | ORAL | Status: AC
  Administered 2014-09-26: 324 mg via ORAL

## 2014-09-26 MED ORDER — SODIUM CHLORIDE 0.9 % IV SOLN
Freq: Once | INTRAVENOUS | Status: AC
  Administered 2014-09-26: 20 mL/h via INTRAVENOUS

## 2014-09-26 MED ORDER — ASPIRIN 81 MG PO CHEW
CHEWABLE_TABLET | ORAL | Status: AC
  Filled 2014-09-26: qty 4

## 2014-09-26 MED ORDER — NITROGLYCERIN 0.4 MG SL SUBL
SUBLINGUAL_TABLET | SUBLINGUAL | Status: AC
  Filled 2014-09-26: qty 1

## 2014-09-26 NOTE — ED Notes (Signed)
Pt states understanding of D/C instructions. NAD noted, VS stable and WNL at time of D/C.

## 2014-09-26 NOTE — ED Provider Notes (Signed)
Medical screening examination/treatment/procedure(s) were conducted as a shared visit with non-physician practitioner(s) and myself.  I personally evaluated the patient during the encounter.   EKG Interpretation   Date/Time:  Wednesday September 26 2014 12:08:12 EDT Ventricular Rate:  88 PR Interval:  209 QRS Duration: 98 QT Interval:  399 QTC Calculation: 483 R Axis:   -37 Text Interpretation:  Sinus rhythm Borderline prolonged PR interval  Inferior infarct, old Anterior infarct, old No old tracing to compare  Confirmed by Ethelda ChickJACUBOWITZ  MD, Brigitt Mcclish [phone]) on 09/26/2014 12:31:29 PM       Doug SouSam Shaquasia Caponigro, MD 09/26/14 [id number]

## 2014-09-26 NOTE — Discharge Instructions (Signed)

## 2014-09-26 NOTE — ED Provider Notes (Signed)
CSN: [id number]     Arrival date & time 09/26/14  1158 History   First MD Initiated Contact with Patient 09/26/14 1201     Chief Complaint  Patient presents with  . Chest Pain     (Consider location/radiation/quality/duration/timing/severity/associated sxs/prior Treatment) HPI Comments: Patient is a 43 year old female with history of thyroid nodule who presents to the emergency department today for evaluation of chest pain. She reports that this has been ongoing intermittently for the past month. She states her chest pain is substernal and describes it as a dull ache. The pain does not radiate. She reports when she wakes up she does not have any chest pain. As the day progresses she sometimes develops one to 2 minutes of chest pain which time. Last night she had an episode of chest pain that was associated with shortness of breath. This lasted less than 1 minute. It resolved spontaneously. Patient then had another episode of chest pain earlier today. This prompted her to go to urgent care. She was given 1 nitroglycerin at urgent care and her chest pain resolved. She is currently chest pain free. She denies any associated lightheadedness, dizziness, nausea, vomiting, diaphoresis. She reports positive family history of her grandmother having early heart disease. She is unable to give any additional details. No recent travel, surgeries, prior DVT or PE. She has never been a smoker.  The history is provided by the patient. No language interpreter was used.    Past Medical History  Diagnosis Date  . Swollen lymph nodes   . Thyroid nodule    Past Surgical History  Procedure Laterality Date  . Cesarean section    . Polypectomy  ?    uterine  . Cholecystectomy  2012   History reviewed. No pertinent family history. History  Substance Use Topics  . Smoking status: Never Smoker   . Smokeless tobacco: Never Used  . Alcohol Use: No   OB History   Grav Para Term Preterm Abortions TAB SAB Ect  Mult Living                 Review of Systems  Constitutional: Negative for fever, chills and diaphoresis.  Respiratory: Positive for shortness of breath. Negative for cough.   Cardiovascular: Positive for chest pain. Negative for leg swelling.  Gastrointestinal: Negative for nausea, vomiting and abdominal pain.  All other systems reviewed and are negative.     Allergies  Review of patient's allergies indicates no known allergies.  Home Medications   Prior to Admission medications   Medication Sig Start Date End Date Taking? Authorizing Provider  Prenatal MV-Min-Fe Fum-FA-DHA (PRENATAL 1 PO) Take 1 tablet by mouth daily.      Historical Provider, MD   BP 155/83  Pulse 89  Temp(Src) 98 F (36.7 C) (Oral)  Resp 20  SpO2 100%  LMP 08/28/2014 Physical Exam  Nursing note and vitals reviewed. Constitutional: She is oriented to person, place, and time. She appears well-developed and well-nourished. No distress.  Obese  HENT:  Head: Normocephalic and atraumatic.  Right Ear: External ear normal.  Left Ear: External ear normal.  Nose: Nose normal.  Mouth/Throat: Oropharynx is clear and moist.  Eyes: Conjunctivae are normal.  Neck: Normal range of motion.  Cardiovascular: Normal rate, regular rhythm, normal heart sounds, intact distal pulses and normal pulses.   Pulses:      Radial pulses are 2+ on the right side, and 2+ on the left side.       Posterior tibial  pulses are 2+ on the right side, and 2+ on the left side.  No leg swelling  Pulmonary/Chest: Effort normal and breath sounds normal. No stridor. No respiratory distress. She has no wheezes. She has no rales.  Abdominal: Soft. She exhibits no distension.  Musculoskeletal: Normal range of motion.  Neurological: She is alert and oriented to person, place, and time. She has normal strength.  Skin: Skin is warm and dry. She is not diaphoretic. No erythema.  Psychiatric: She has a normal mood and affect. Her behavior is  normal.    ED Course  Procedures (including critical care time) Labs Review Labs Reviewed  CBC WITH DIFFERENTIAL - Abnormal; Notable for the following:    RBC 5.79 (*)    MCV 71.7 (*)    MCH 23.1 (*)    All other components within normal limits  COMPREHENSIVE METABOLIC PANEL  I-STAT TROPOININ, ED  Rosezena SensorI-STAT TROPOININ, ED    Imaging Review Dg Chest 2 View  09/26/2014   CLINICAL DATA:  Chest pain for 2 months  EXAM: CHEST  2 VIEW  COMPARISON:  None.  FINDINGS: The heart size and mediastinal contours are within normal limits. Both lungs are clear. The visualized skeletal structures are unremarkable.  IMPRESSION: No active cardiopulmonary disease.   Electronically Signed   By: Alcide CleverMark  Lukens [name]   On: 09/26/2014 13:19     EKG Interpretation   Date/Time:  Wednesday September 26 2014 12:08:12 EDT Ventricular Rate:  88 PR Interval:  209 QRS Duration: 98 QT Interval:  399 QTC Calculation: 483 R Axis:   -37 Text Interpretation:  Sinus rhythm Borderline prolonged PR interval  Inferior infarct, old Anterior infarct, old No old tracing to compare  Confirmed by JACUBOWITZ  MD, SAM [phone]) on 09/26/2014 12:31:29 PM      MDM   Final diagnoses:  Chest pain    Patient is to be discharged with recommendation to follow up with PCP in regards to today's hospital visit. Chest pain is not likely of cardiac or pulmonary etiology d/t presentation, perc negative, VSS, no tracheal deviation, no JVD or new murmur, RRR, breath sounds equal bilaterally, EKG without acute abnormalities, negative troponin x 2, and negative CXR. Pt has been advised to return to the ED is CP becomes exertional, associated with diaphoresis or nausea, radiates to left jaw/arm, worsens or becomes concerning in any way. Pt appears reliable for follow up and is agreeable to discharge.   Case has been discussed with and seen by Dr. Ethelda ChickJacubowitz who agrees with the above plan to discharge.      Mora BellmanHannah S Galadriel Shroff, PA-C 09/26/14  1549

## 2014-09-26 NOTE — ED Notes (Signed)
Patient has had relief of her chest pain with 1 nitro.  IV, O2, Monitor, ASA and  1 NItro all done per protocol.  Patient is stable at this time and rates pain as a 1.

## 2014-09-26 NOTE — ED Notes (Addendum)
[facility] with [facility]/o [facility] pain. Started last night--but has been going on for approx 1 month intermittently. Starts mostly in afternoon while at work. Pain free at present.

## 2014-09-26 NOTE — ED Provider Notes (Signed)
Medical screening examination/treatment/procedure(s) were performed by non-physician practitioner and as supervising physician I was immediately available for consultation/collaboration.  Leslee Homeavid Yolanda Huffstetler, [name]  Reuben Likesavid C Beadie Matsunaga, MD 09/26/14 [phone]

## 2014-09-26 NOTE — ED Provider Notes (Signed)
CSN: [id number]     Arrival date & time 09/26/14  [id number] History   First MD Initiated Contact with Patient 09/26/14 1015     Chief Complaint  Patient presents with  . Chest Pain   (Consider location/radiation/quality/duration/timing/severity/associated sxs/prior Treatment) HPI     43 year old female with no significant past medical history presents complaining of chest pain. She reports chest pain with associated tightness in her neck for approximately the past 6 weeks. She reports that she gets daily substernal chest pain, described as tightness or squeezing. It radiates to the neck. It is rated about 4 out of 10 in severity on a daily basis. This occurs for about 3-4 hours, usually midday. It is occasionally associated with exertion but not always. It is relieved by rest. It does occur at rest sometimes. Last night the pain got worse, 7-8 out of 10 in severity, and was associated with nausea and shortness of breath. She did not want to call an ambulance at that time, she decided to wait till today to come to the urgent care for evaluation. She is still experiencing 3 out of 10 chest pain. No nausea or vomiting today. No shortness of breath. The pain does radiate to her neck. No history of DVT or PE, recent travel. No personal or family history of cardiovascular disease. She does not take any medications. She does not smoke  Past Medical History  Diagnosis Date  . Swollen lymph nodes   . Thyroid nodule    Past Surgical History  Procedure Laterality Date  . Cesarean section    . Polypectomy  ?    uterine  . Cholecystectomy  2012   No family history on file. History  Substance Use Topics  . Smoking status: Never Smoker   . Smokeless tobacco: Never Used  . Alcohol Use: No   OB History   Grav Para Term Preterm Abortions TAB SAB Ect Mult Living                 Review of Systems  Constitutional: Negative for fever, chills and fatigue.  Eyes: Negative for visual disturbance.   Respiratory: Positive for shortness of breath. Negative for cough and wheezing.   Cardiovascular: Positive for chest pain. Negative for palpitations and leg swelling.  Gastrointestinal: Positive for nausea. Negative for vomiting, abdominal pain, diarrhea and constipation.  Endocrine: Negative for polydipsia and polyuria.  Genitourinary: Negative for dysuria, urgency and frequency.  Musculoskeletal: Positive for neck pain. Negative for arthralgias and myalgias.  Skin: Negative for rash.  Neurological: Negative for dizziness, weakness and light-headedness.  All other systems reviewed and are negative.   Allergies  Review of patient's allergies indicates no known allergies.  Home Medications   Prior to Admission medications   Medication Sig Start Date End Date Taking? Authorizing Provider  Prenatal MV-Min-Fe Fum-FA-DHA (PRENATAL 1 PO) Take 1 tablet by mouth daily.      Historical Provider, MD   LMP 08/28/2014 Physical Exam  Nursing note and vitals reviewed. Constitutional: She is oriented to person, place, and time. Vital signs are normal. She appears well-developed and well-nourished. No distress.  HENT:  Head: Normocephalic and atraumatic.  Neck: Normal range of motion. Neck supple.  Cardiovascular: Normal rate, regular rhythm and normal heart sounds.   Pulmonary/Chest: Effort normal and breath sounds normal. No respiratory distress.  Neurological: She is alert and oriented to person, place, and time. She has normal strength. Coordination normal.  Skin: Skin is warm and dry. No rash noted. She  is not diaphoretic.  Psychiatric: She has a normal mood and affect. Judgment normal.    ED Course  Procedures (including critical care time) Labs Review Labs Reviewed - No data to display  Imaging Review No results found.  EKG normal   MDM   1. Other chest pain    Concerning for unstable angina. Chest pain has been relieved by nitroglycerin. IV started, cardiac monitoring, 324  mg of aspirin chewed, 0.4 mg of sublingual nitroglycerin, transported via EMS.     Meds ordered this encounter  Medications  . 0.9 %  sodium chloride infusion    Sig:   . nitroGLYCERIN (NITROSTAT) SL tablet 0.4 mg    Sig:   . aspirin chewable tablet 324 mg    Sig:       Graylon GoodZachary H Domanick Cuccia, PA-C 09/26/14 1106

## 2014-09-26 NOTE — ED Notes (Signed)
43 year old female with C/o chest pain, SOB,and neck pain on and off for  1 1/2 months.  Here today because along with these symptoms she also had nausea.   Right now patient states that she feels  Fine.

## 2014-09-26 NOTE — ED Notes (Signed)
[facility].  Report called to the  Charge nurse at 11:20

## 2014-09-26 NOTE — ED Provider Notes (Signed)
Complains of intermittent chest pain 1 month, unchanged pain moves from left chest to right chest and to epigastric area. Not made better or worse by anything. Nonexertional. No associated shortness of breath nausea or sweatiness cardiac risk factors none. Presently asymptomatic on exam lungs clear auscultation heart regular rate and rhythm no murmurs abdomen mildly obese, nontender extremities without edema. Heart score equals 1 based on EKG criteria. Plan outpatient follow-up with primary care physician  Doug SouSam Sadiya Durand, MD 09/26/14 [phone]

## 2015-02-26 ENCOUNTER — Other Ambulatory Visit: Payer: Self-pay | Admitting: Obstetrics and Gynecology

## 2015-02-28 LAB — CYTOLOGY - PAP

## 2015-07-19 IMAGING — MG MM SCREENING BREAST TOMO BILATERAL
8 of 12 series · 8 of 28 positions shown · non-contrast
Comparison: None.

CLINICAL DATA: Screening.

EXAM:
DIGITAL SCREENING BILATERAL MAMMOGRAM WITH 3D TOMO WITH CAD

[L CC synth-2D]
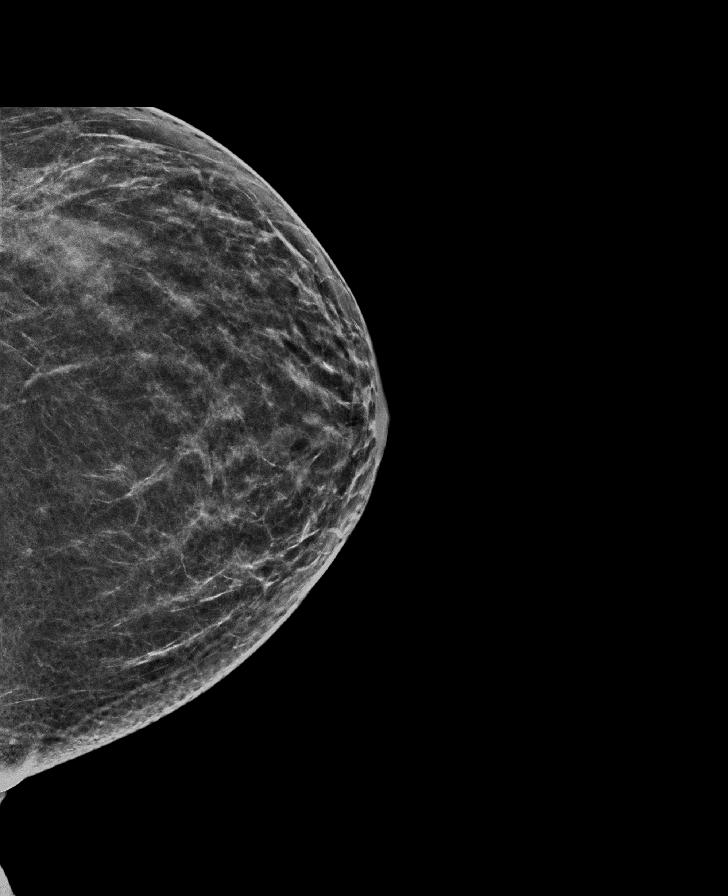

[R MLO]
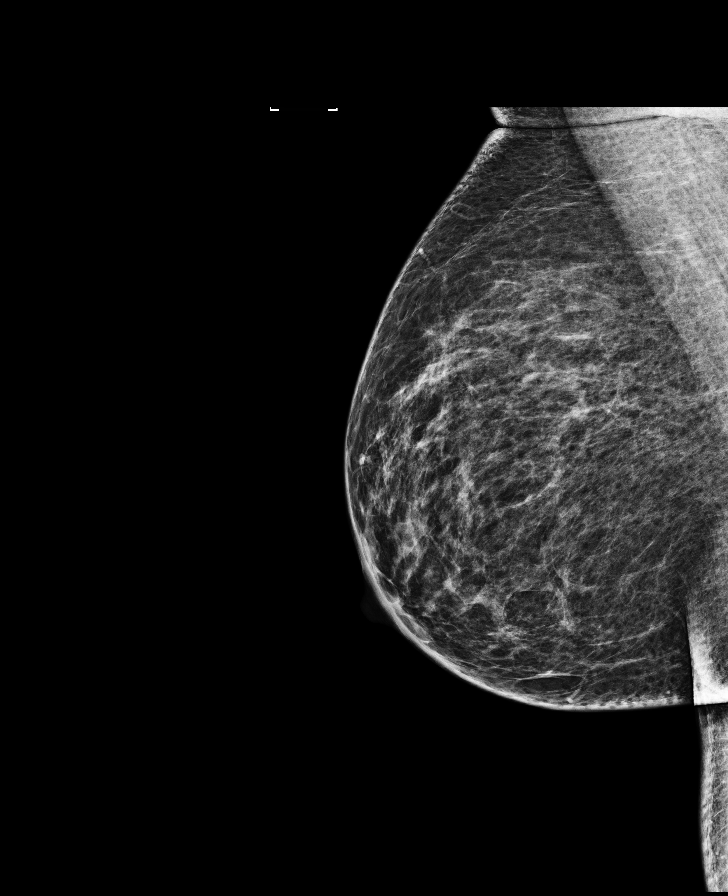

[L MLO synth-2D]
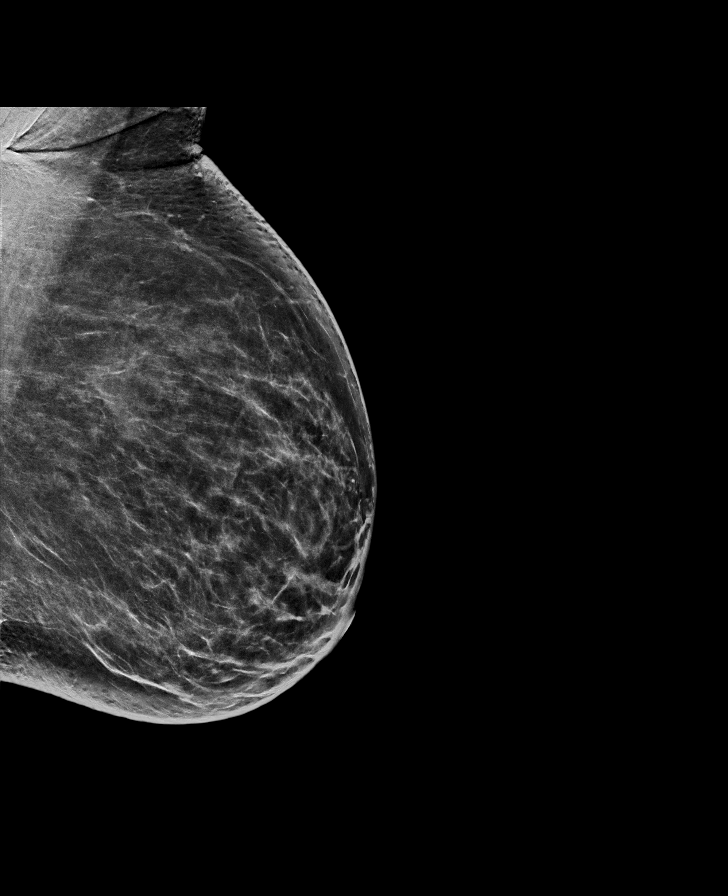

[L CC]
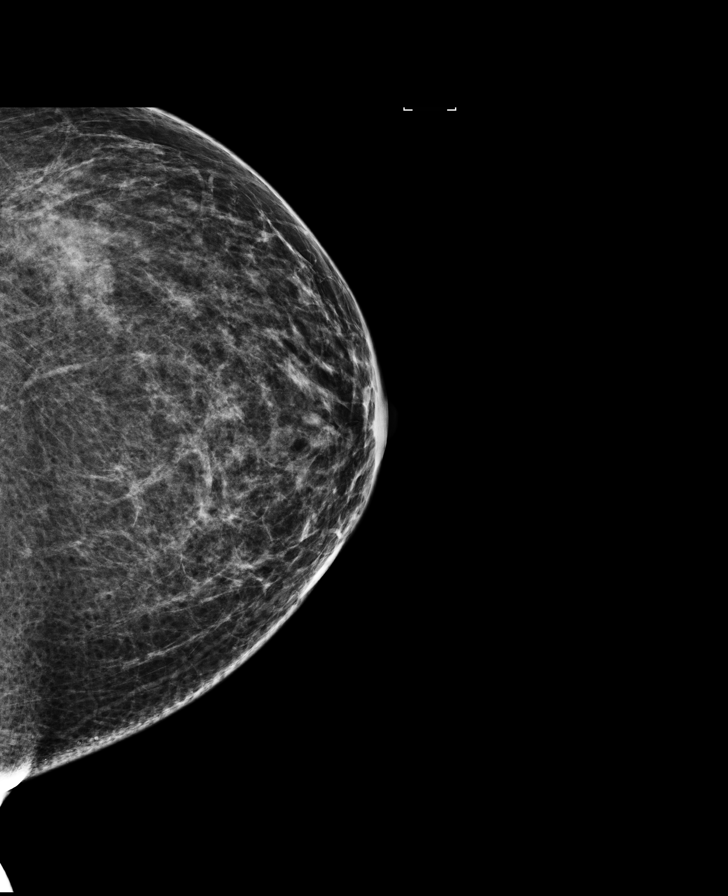

[R MLO synth-2D]
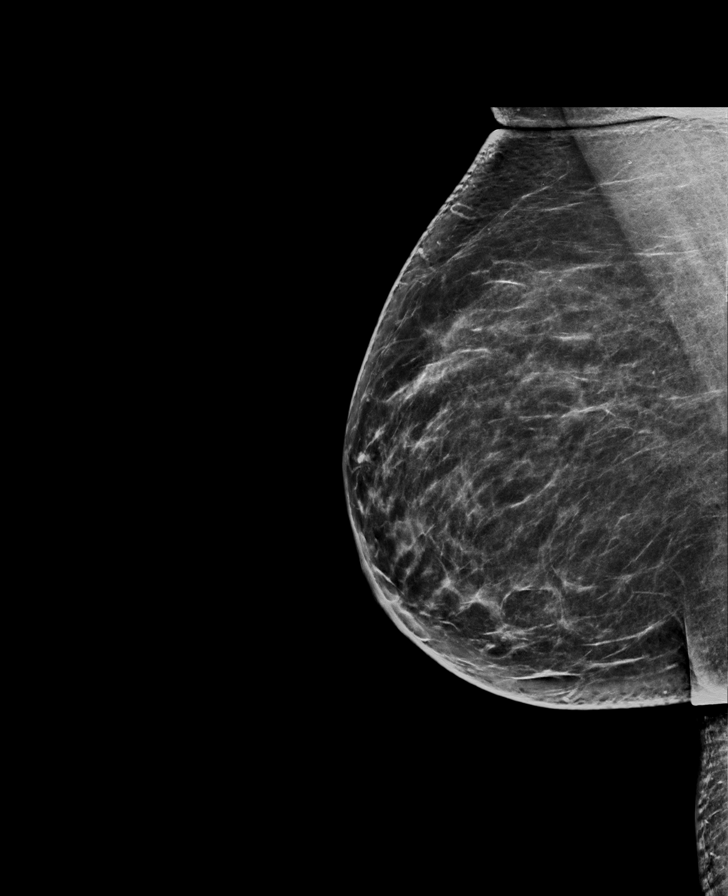

[L MLO]
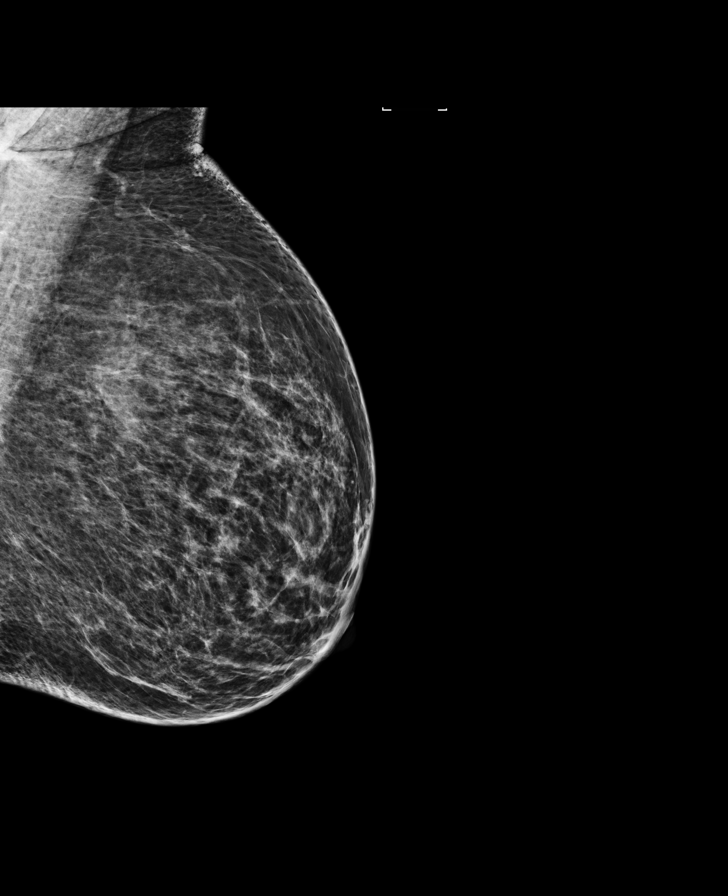

[R CC synth-2D]
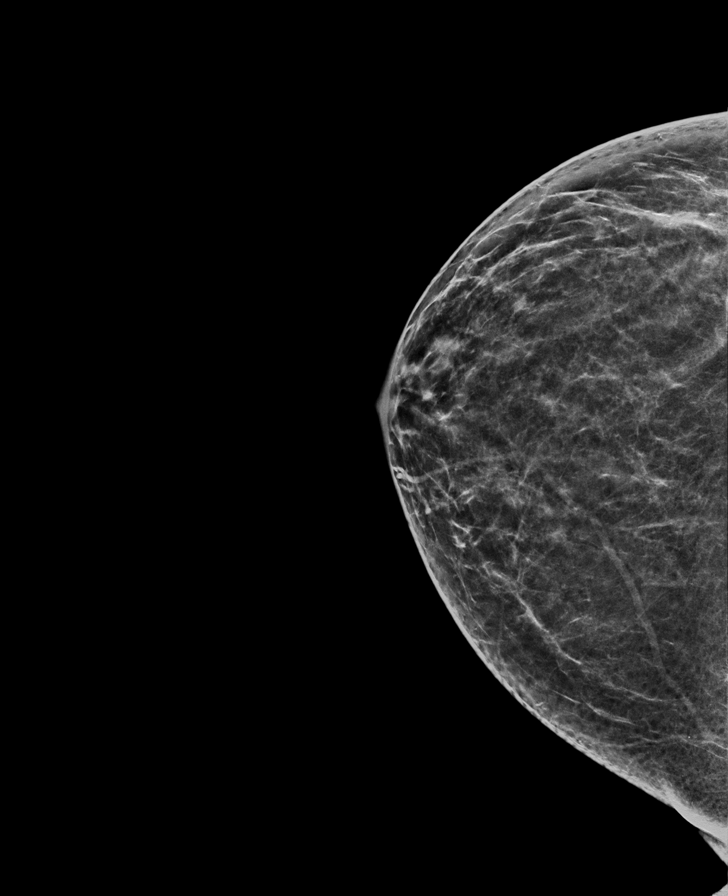

[R CC]
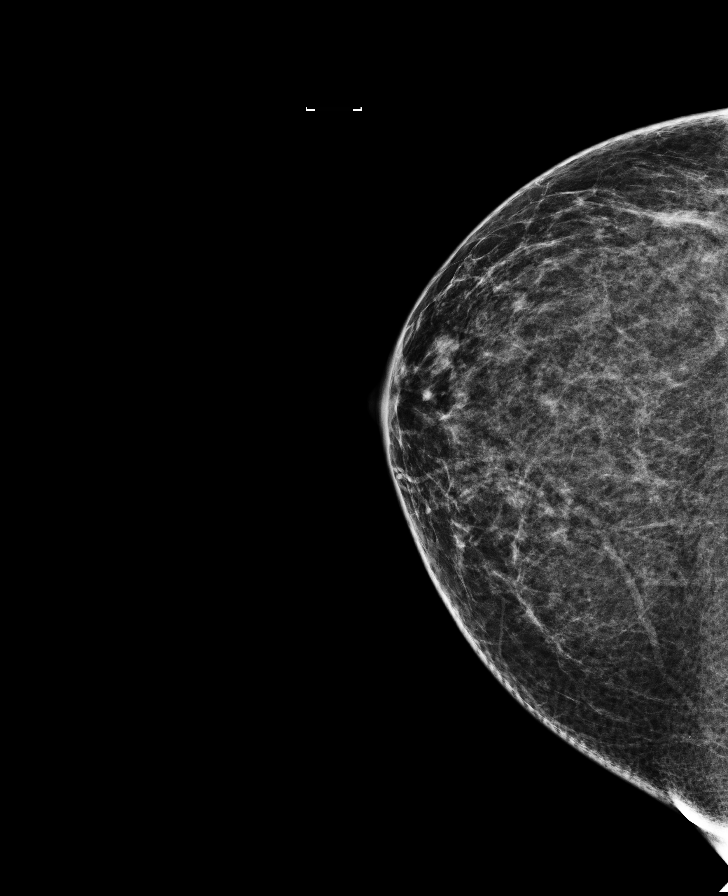

[8 of 28 positions shown; findings below may reference images not displayed]

ACR Breast Density Category b: There are scattered areas of
fibroglandular density.
FINDINGS: There are no findings suspicious for malignancy. Images were
processed with CAD.
IMPRESSION: No mammographic evidence of malignancy. A result letter of this
screening mammogram will be mailed directly to the patient.

RECOMMENDATION:
Screening mammogram in one year. (Code:2K-Q-4CQ)

BI-RADS CATEGORY  1: Negative.

## 2016-02-26 IMAGING — CR DG CHEST 2V
2 series · 2 of 2 positions shown · non-contrast
Comparison: None.

CLINICAL DATA: Chest pain for 2 months

EXAM:
CHEST  2 VIEW

[w chest pa]
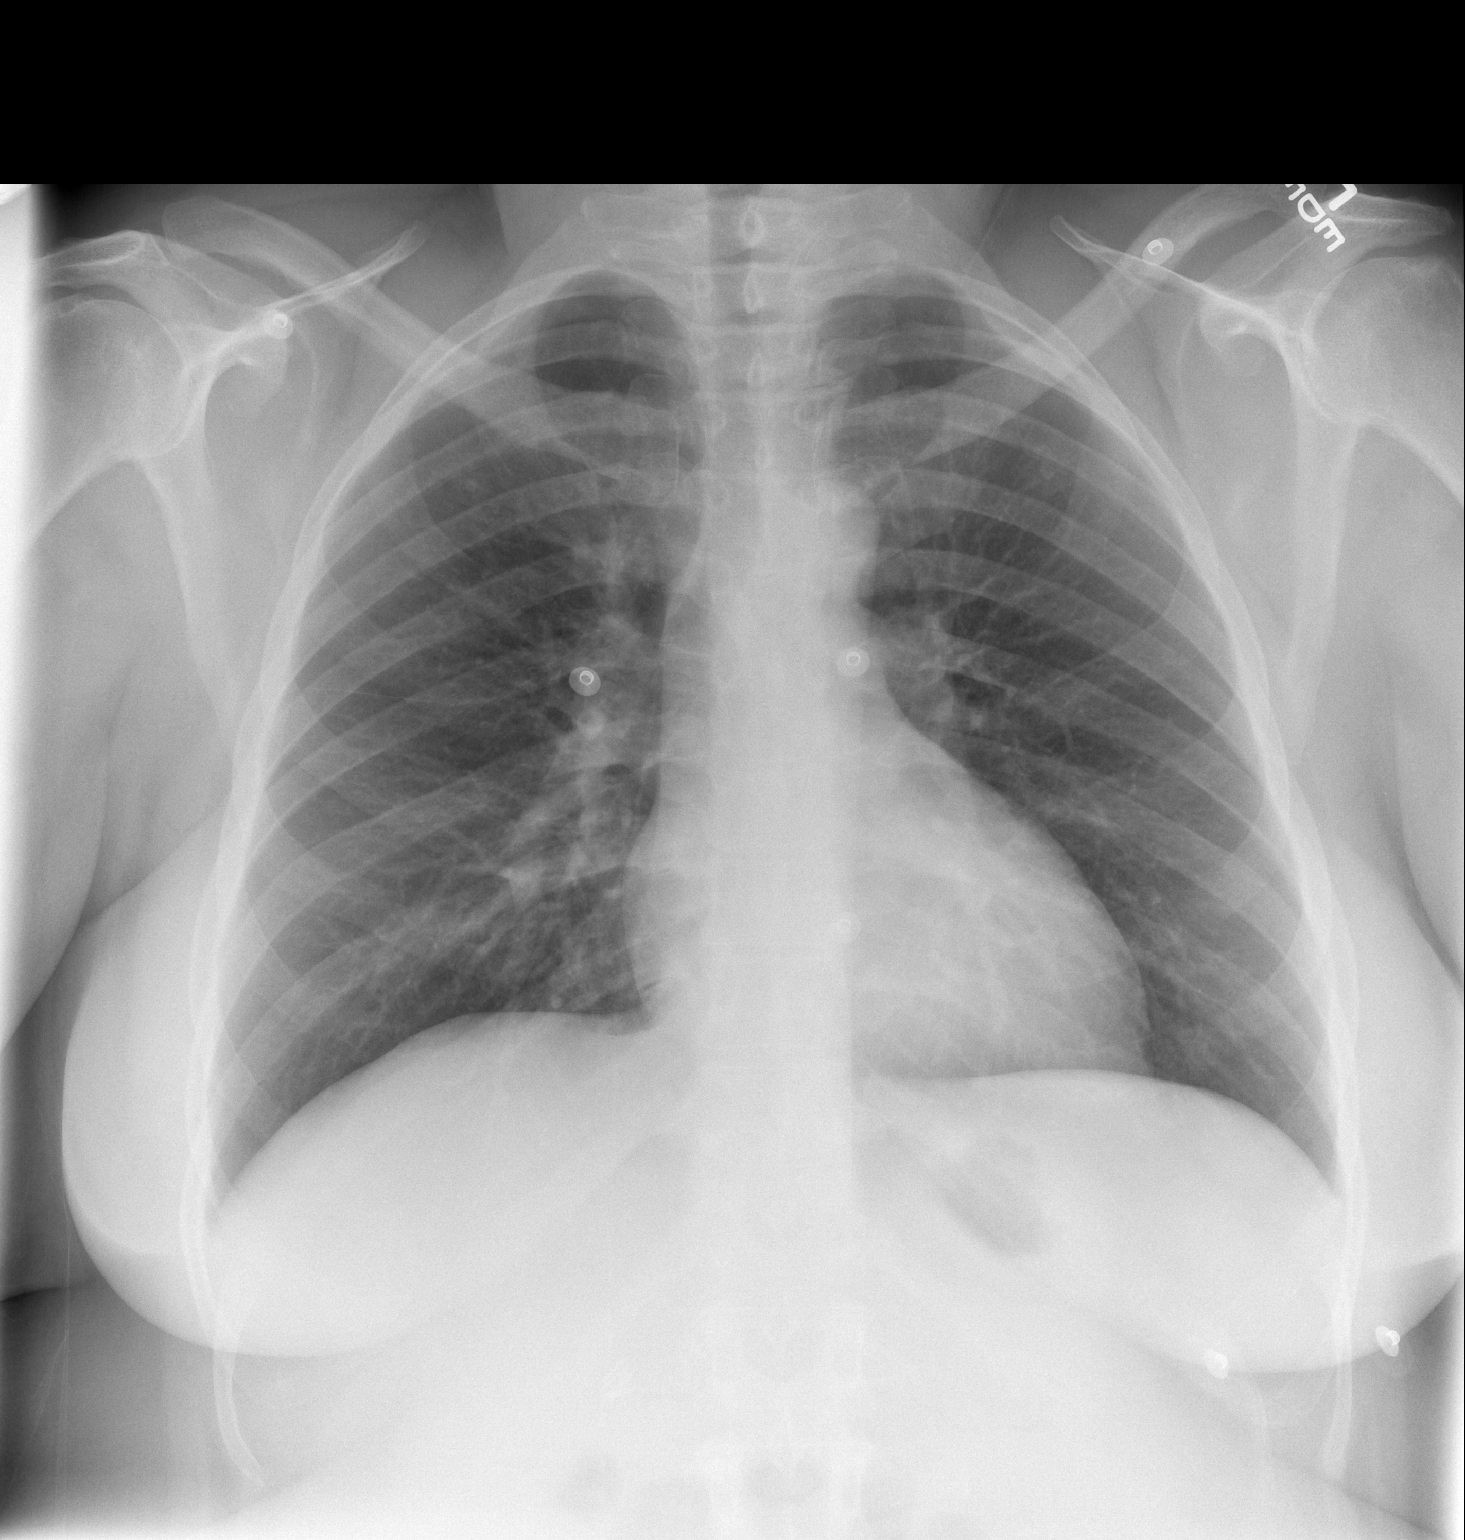

[w chest lat]
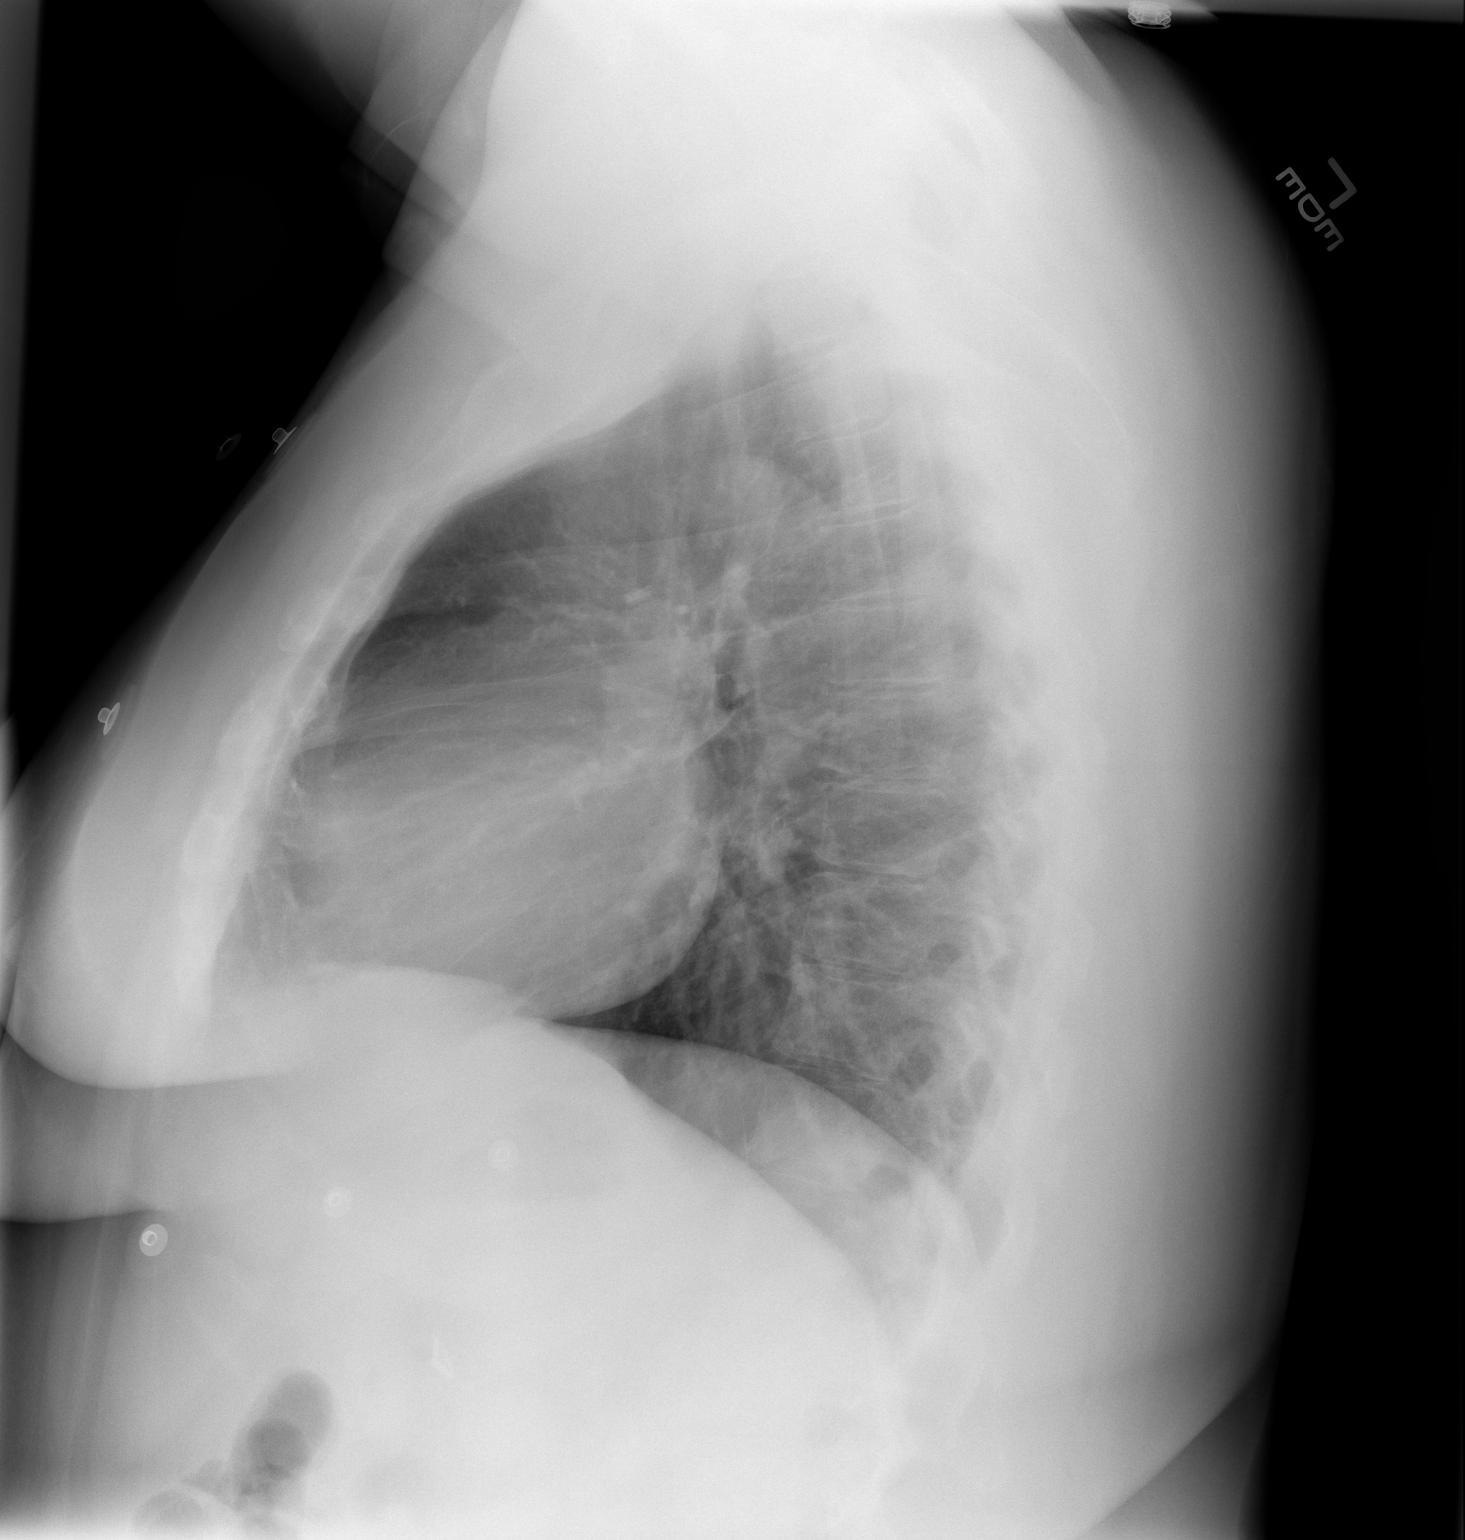

[2 of 2 positions shown; findings below may reference images not displayed]

FINDINGS: The heart size and mediastinal contours are within normal limits.
Both lungs are clear. The visualized skeletal structures are
unremarkable.
IMPRESSION: No active cardiopulmonary disease.

## 2016-08-12 ENCOUNTER — Other Ambulatory Visit: Payer: Self-pay | Admitting: Obstetrics and Gynecology

## 2016-08-13 ENCOUNTER — Other Ambulatory Visit: Payer: Self-pay | Admitting: Obstetrics and Gynecology

## 2016-08-13 LAB — CYTOLOGY - PAP

## 2016-09-19 ENCOUNTER — Emergency Department (HOSPITAL_COMMUNITY)
Admission: EM | Admit: 2016-09-19 | Discharge: 2016-09-19 | Disposition: A | Discharge status: Home / Self Care | Payer: 59 | Attending: Emergency Medicine | Admitting: Emergency Medicine

## 2016-09-19 ENCOUNTER — Encounter (HOSPITAL_COMMUNITY): Payer: Self-pay | Admitting: *Deleted

## 2016-09-19 DIAGNOSIS — G4489 Other headache syndrome: Secondary | ICD-10-CM | POA: Insufficient documentation

## 2016-09-19 DIAGNOSIS — R51 Headache: Secondary | ICD-10-CM | POA: Diagnosis present

## 2016-09-19 LAB — SEDIMENTATION RATE: Sed Rate: 8 mm/hr (ref 0–22)

## 2016-09-19 MED ORDER — KETOROLAC TROMETHAMINE 60 MG/2ML IM SOLN
30.0000 mg | Freq: Once | INTRAMUSCULAR | Status: AC
  Administered 2016-09-19: 30 mg via INTRAMUSCULAR
  Filled 2016-09-19: qty 2

## 2016-09-19 MED ORDER — BUTALBITAL-APAP-CAFFEINE 50-325-40 MG PO TABS: 1.0000 | ORAL_TABLET | Freq: Four times a day (QID) | ORAL | 0 refills | Status: AC | PRN

## 2016-09-19 NOTE — ED Provider Notes (Signed)
[facility]-EMERGENCY DEPT Provider Note   CSN: [id number] Arrival date & time: 09/19/16  1104     History   Chief Complaint Chief Complaint  Patient presents with  . Headache  . Otalgia    HPI [name] is a 45 y.o. female.  45 year old female presents with one-week history of left-sided scalp pain. Pain is characterized as persistent and sharp and localized to her left parietal scalp region. Denies any fever, photophobia, neck pain. Seen by her Dr. recently for similar symptoms was diagnosed with otitis media. Denies any ear drainage. Denies any rashes to the skin. Denies any visual changes. Has used ibuprofen with temporary relief. States that she gets these symptoms at the same time every year when the seasons change      Past Medical History:  Diagnosis Date  . Swollen lymph nodes   . Thyroid nodule     Patient Active Problem List   Diagnosis Date Noted  . Chronic calculus cholecystitis 06/25/2011    Past Surgical History:  Procedure Laterality Date  . CESAREAN SECTION    . CHOLECYSTECTOMY  2012  . POLYPECTOMY  ?   uterine    OB History    No data available       Home Medications    Prior to Admission medications   Medication Sig Start Date End Date Taking? Authorizing Provider  amoxicillin (AMOXIL) 875 MG tablet Take 1 tablet by mouth 2 (two) times daily. 09/15/16  Yes Historical Provider, MD  ibuprofen (ADVIL,MOTRIN) 200 MG tablet Take 400-600 mg by mouth every 6 (six) hours as needed for moderate pain.    Yes Historical Provider, MD  pseudoephedrine (SUDAFED) 60 MG tablet Take 60 mg by mouth every 4 (four) hours as needed for congestion.   Yes Historical Provider, MD    Family History No family history on file.  Social History Social History  Substance Use Topics  . Smoking status: Never Smoker  . Smokeless tobacco: Never Used  . Alcohol use No     Allergies   Review of patient's allergies indicates no known allergies.   Review of  Systems Review of Systems  All other systems reviewed and are negative.    Physical Exam Updated Vital Signs BP 143/95 (BP Location: Right Arm)   Pulse 80   Temp 98.1 F (36.7 C) (Oral)   Resp 16   Ht 5\' 11"  (1.803 m)   Wt 112 kg   LMP 08/31/2016 (Approximate)   SpO2 100%   BMI 34.45 kg/m   Physical Exam  Constitutional: She is oriented to person, place, and time. She appears well-developed and well-nourished.  Non-toxic appearance. No distress.  HENT:  Head: Normocephalic and atraumatic.    Eyes: Conjunctivae, EOM and lids are normal. Pupils are equal, round, and reactive to light.  Neck: Normal range of motion. Neck supple. No tracheal deviation present. No thyroid mass present.  Cardiovascular: Normal rate, regular rhythm and normal heart sounds.  Exam reveals no gallop.   No murmur heard. Pulmonary/Chest: Effort normal and breath sounds normal. No stridor. No respiratory distress. She has no decreased breath sounds. She has no wheezes. She has no rhonchi. She has no rales.  Abdominal: Soft. Normal appearance and bowel sounds are normal. She exhibits no distension. There is no tenderness. There is no rebound and no CVA tenderness.  Musculoskeletal: Normal range of motion. She exhibits no edema or tenderness.  Neurological: She is alert and oriented to person, place, and time. She has normal  strength. No cranial nerve deficit or sensory deficit. GCS eye subscore is 4. GCS verbal subscore is 5. GCS motor subscore is 6.  Skin: Skin is warm and dry. No abrasion and no rash noted.  Psychiatric: She has a normal mood and affect. Her speech is normal and behavior is normal.  Nursing note and vitals reviewed.    ED Treatments / Results  Labs (all labs ordered are listed, but only abnormal results are displayed) Labs Reviewed  SEDIMENTATION RATE    EKG  EKG Interpretation None       Radiology No results found.  Procedures Procedures (including critical care  time)  Medications Ordered in ED Medications  ketorolac (TORADOL) injection 30 mg (not administered)     Initial Impression / Assessment and Plan / ED Course  I have reviewed the triage vital signs and the nursing notes.  Pertinent labs & imaging results that were available during my care of the patient were reviewed by me and considered in my medical decision making (see chart for details).  Clinical Course    Patient's sedimentation rate negative. Given Toradol and feels better. Suspect tension headache. Do not think that she has subarachnoid or other intracranial process.  Final Clinical Impressions(s) / ED Diagnoses   Final diagnoses:  None    New Prescriptions New Prescriptions   No medications on file     Lorre Nick, MD 09/19/16 1342

## 2016-09-19 NOTE — ED Triage Notes (Signed)
Patient states on Monday last week she began to have a headache on the left side of her head.  Patient went to her PCP on Tuesday and was dx with left ear infection.  She was started on Amoxicillin and ibuprofen, but she continues to have head pain.  Patient denies sinus drainage, cough, fever and N/V/D.  She also denies eye drainage and light sensitivity.  Patient states each Fall she has a similar episode, but the pain doesn't usually last this long.  She states typically she gets abx and "a shot" of something from her PCP and she feels better in several days.

## 2017-02-15 DIAGNOSIS — Z0000 Encounter for general adult medical examination without abnormal findings: Secondary | ICD-10-CM | POA: Diagnosis not present

## 2017-02-15 DIAGNOSIS — R7303 Prediabetes: Secondary | ICD-10-CM | POA: Diagnosis not present

## 2017-02-15 DIAGNOSIS — E042 Nontoxic multinodular goiter: Secondary | ICD-10-CM | POA: Diagnosis not present

## 2017-02-15 DIAGNOSIS — E7800 Pure hypercholesterolemia, unspecified: Secondary | ICD-10-CM | POA: Diagnosis not present

## 2017-03-09 DIAGNOSIS — N920 Excessive and frequent menstruation with regular cycle: Secondary | ICD-10-CM | POA: Diagnosis not present

## 2017-08-05 DIAGNOSIS — N390 Urinary tract infection, site not specified: Secondary | ICD-10-CM | POA: Diagnosis not present

## 2017-08-06 DIAGNOSIS — R300 Dysuria: Secondary | ICD-10-CM | POA: Diagnosis not present

## 2017-08-06 DIAGNOSIS — N760 Acute vaginitis: Secondary | ICD-10-CM | POA: Diagnosis not present

## 2017-08-18 DIAGNOSIS — R7303 Prediabetes: Secondary | ICD-10-CM | POA: Diagnosis not present

## 2017-08-18 DIAGNOSIS — E042 Nontoxic multinodular goiter: Secondary | ICD-10-CM | POA: Diagnosis not present

## 2017-08-18 DIAGNOSIS — E7800 Pure hypercholesterolemia, unspecified: Secondary | ICD-10-CM | POA: Diagnosis not present

## 2017-09-23 DIAGNOSIS — Z01419 Encounter for gynecological examination (general) (routine) without abnormal findings: Secondary | ICD-10-CM | POA: Diagnosis not present

## 2017-09-23 DIAGNOSIS — Z1231 Encounter for screening mammogram for malignant neoplasm of breast: Secondary | ICD-10-CM | POA: Diagnosis not present

## 2017-10-28 DIAGNOSIS — N760 Acute vaginitis: Secondary | ICD-10-CM | POA: Diagnosis not present

## 2017-10-28 DIAGNOSIS — E119 Type 2 diabetes mellitus without complications: Secondary | ICD-10-CM | POA: Diagnosis not present

## 2018-02-21 DIAGNOSIS — E7800 Pure hypercholesterolemia, unspecified: Secondary | ICD-10-CM | POA: Diagnosis not present

## 2018-02-21 DIAGNOSIS — E042 Nontoxic multinodular goiter: Secondary | ICD-10-CM | POA: Diagnosis not present

## 2018-02-21 DIAGNOSIS — E119 Type 2 diabetes mellitus without complications: Secondary | ICD-10-CM | POA: Diagnosis not present

## 2018-02-21 DIAGNOSIS — Z0000 Encounter for general adult medical examination without abnormal findings: Secondary | ICD-10-CM | POA: Diagnosis not present

## 2018-11-16 DIAGNOSIS — Z1231 Encounter for screening mammogram for malignant neoplasm of breast: Secondary | ICD-10-CM | POA: Diagnosis not present

## 2018-11-16 DIAGNOSIS — Z01419 Encounter for gynecological examination (general) (routine) without abnormal findings: Secondary | ICD-10-CM | POA: Diagnosis not present

## 2018-11-17 DIAGNOSIS — H9202 Otalgia, left ear: Secondary | ICD-10-CM | POA: Diagnosis not present

## 2021-08-08 ENCOUNTER — Other Ambulatory Visit: Payer: Self-pay | Admitting: Family Medicine

## 2021-08-08 DIAGNOSIS — E042 Nontoxic multinodular goiter: Secondary | ICD-10-CM

## 2021-08-14 ENCOUNTER — Ambulatory Visit
Admission: RE | Admit: 2021-08-14 | Discharge: 2021-08-14 | Disposition: A | Discharge status: Home / Self Care | Payer: 59 | Source: Ambulatory Visit | Attending: Family Medicine | Admitting: Family Medicine

## 2021-08-14 DIAGNOSIS — E042 Nontoxic multinodular goiter: Secondary | ICD-10-CM
